# Patient Record
Sex: Female | Born: 1990 | Race: White | Hispanic: No | Marital: Married | Smoking: Never smoker
Health system: Southern US, Community
[De-identification: ages and names within clinical notes are randomized; demographics above are authoritative.]

## PROBLEM LIST (undated history)

## (undated) DIAGNOSIS — O139 Gestational [pregnancy-induced] hypertension without significant proteinuria, unspecified trimester: Secondary | ICD-10-CM

## (undated) DIAGNOSIS — T7840XA Allergy, unspecified, initial encounter: Secondary | ICD-10-CM

## (undated) DIAGNOSIS — F419 Anxiety disorder, unspecified: Secondary | ICD-10-CM

## (undated) DIAGNOSIS — O09299 Supervision of pregnancy with other poor reproductive or obstetric history, unspecified trimester: Secondary | ICD-10-CM

## (undated) DIAGNOSIS — F32A Depression, unspecified: Secondary | ICD-10-CM

## (undated) HISTORY — DX: Supervision of pregnancy with other poor reproductive or obstetric history, unspecified trimester: O09.299

## (undated) HISTORY — DX: Anxiety disorder, unspecified: F41.9

## (undated) HISTORY — DX: Allergy, unspecified, initial encounter: T78.40XA

## (undated) HISTORY — DX: Gestational (pregnancy-induced) hypertension without significant proteinuria, unspecified trimester: O13.9

## (undated) HISTORY — DX: Depression, unspecified: F32.A

## (undated) HISTORY — PX: CHOLECYSTECTOMY: SHX55

---

## 2020-03-30 ENCOUNTER — Other Ambulatory Visit: Payer: BC Managed Care – PPO

## 2020-03-30 DIAGNOSIS — Z20822 Contact with and (suspected) exposure to covid-19: Secondary | ICD-10-CM

## 2020-03-31 LAB — NOVEL CORONAVIRUS, NAA: SARS-CoV-2, NAA: NOT DETECTED

## 2020-03-31 LAB — SARS-COV-2, NAA 2 DAY TAT

## 2020-06-07 ENCOUNTER — Emergency Department (HOSPITAL_COMMUNITY)
Admission: EM | Admit: 2020-06-07 | Discharge: 2020-06-07 | Disposition: A | Discharge status: Home / Self Care | Payer: 59 | Attending: Emergency Medicine | Admitting: Emergency Medicine

## 2020-06-07 ENCOUNTER — Other Ambulatory Visit: Payer: Self-pay

## 2020-06-07 ENCOUNTER — Encounter (HOSPITAL_COMMUNITY): Payer: Self-pay | Admitting: Emergency Medicine

## 2020-06-07 ENCOUNTER — Emergency Department (HOSPITAL_COMMUNITY): Payer: 59

## 2020-06-07 DIAGNOSIS — W001XXA Fall from stairs and steps due to ice and snow, initial encounter: Secondary | ICD-10-CM | POA: Diagnosis not present

## 2020-06-07 DIAGNOSIS — Z5321 Procedure and treatment not carried out due to patient leaving prior to being seen by health care provider: Secondary | ICD-10-CM | POA: Insufficient documentation

## 2020-06-07 DIAGNOSIS — M533 Sacrococcygeal disorders, not elsewhere classified: Secondary | ICD-10-CM | POA: Diagnosis present

## 2020-06-07 NOTE — ED Notes (Signed)
Pt spoke to this tech and stated they wanted to go to another hospital to be seen. Pt left at this time.

## 2020-06-07 NOTE — ED Triage Notes (Signed)
Pt reports slipping on ice and falling down approx 4 steps this am. States she thinks she hit her head when she was trying to get up, c/o tailbone pain as well, denies LOC.

## 2020-08-18 DIAGNOSIS — R1319 Other dysphagia: Secondary | ICD-10-CM | POA: Diagnosis not present

## 2020-09-13 DIAGNOSIS — F902 Attention-deficit hyperactivity disorder, combined type: Secondary | ICD-10-CM | POA: Diagnosis not present

## 2020-09-13 DIAGNOSIS — F411 Generalized anxiety disorder: Secondary | ICD-10-CM | POA: Diagnosis not present

## 2020-09-13 DIAGNOSIS — F331 Major depressive disorder, recurrent, moderate: Secondary | ICD-10-CM | POA: Diagnosis not present

## 2020-09-20 DIAGNOSIS — F902 Attention-deficit hyperactivity disorder, combined type: Secondary | ICD-10-CM | POA: Diagnosis not present

## 2020-09-20 DIAGNOSIS — F411 Generalized anxiety disorder: Secondary | ICD-10-CM | POA: Diagnosis not present

## 2020-09-20 DIAGNOSIS — F331 Major depressive disorder, recurrent, moderate: Secondary | ICD-10-CM | POA: Diagnosis not present

## 2020-09-28 DIAGNOSIS — F902 Attention-deficit hyperactivity disorder, combined type: Secondary | ICD-10-CM | POA: Diagnosis not present

## 2020-09-28 DIAGNOSIS — F411 Generalized anxiety disorder: Secondary | ICD-10-CM | POA: Diagnosis not present

## 2020-09-28 DIAGNOSIS — F331 Major depressive disorder, recurrent, moderate: Secondary | ICD-10-CM | POA: Diagnosis not present

## 2020-10-04 DIAGNOSIS — F331 Major depressive disorder, recurrent, moderate: Secondary | ICD-10-CM | POA: Diagnosis not present

## 2020-10-04 DIAGNOSIS — F902 Attention-deficit hyperactivity disorder, combined type: Secondary | ICD-10-CM | POA: Diagnosis not present

## 2020-10-04 DIAGNOSIS — F411 Generalized anxiety disorder: Secondary | ICD-10-CM | POA: Diagnosis not present

## 2020-10-11 DIAGNOSIS — F902 Attention-deficit hyperactivity disorder, combined type: Secondary | ICD-10-CM | POA: Diagnosis not present

## 2020-10-11 DIAGNOSIS — F331 Major depressive disorder, recurrent, moderate: Secondary | ICD-10-CM | POA: Diagnosis not present

## 2020-10-11 DIAGNOSIS — F411 Generalized anxiety disorder: Secondary | ICD-10-CM | POA: Diagnosis not present

## 2020-10-18 DIAGNOSIS — F341 Dysthymic disorder: Secondary | ICD-10-CM | POA: Diagnosis not present

## 2020-10-18 DIAGNOSIS — F840 Autistic disorder: Secondary | ICD-10-CM | POA: Diagnosis not present

## 2020-10-18 DIAGNOSIS — F411 Generalized anxiety disorder: Secondary | ICD-10-CM | POA: Diagnosis not present

## 2020-11-02 DIAGNOSIS — F341 Dysthymic disorder: Secondary | ICD-10-CM | POA: Diagnosis not present

## 2020-11-02 DIAGNOSIS — F840 Autistic disorder: Secondary | ICD-10-CM | POA: Diagnosis not present

## 2020-11-02 DIAGNOSIS — F411 Generalized anxiety disorder: Secondary | ICD-10-CM | POA: Diagnosis not present

## 2020-11-05 DIAGNOSIS — N911 Secondary amenorrhea: Secondary | ICD-10-CM | POA: Diagnosis not present

## 2020-11-08 DIAGNOSIS — F411 Generalized anxiety disorder: Secondary | ICD-10-CM | POA: Diagnosis not present

## 2020-11-08 DIAGNOSIS — F840 Autistic disorder: Secondary | ICD-10-CM | POA: Diagnosis not present

## 2020-11-08 DIAGNOSIS — F341 Dysthymic disorder: Secondary | ICD-10-CM | POA: Diagnosis not present

## 2020-11-11 DIAGNOSIS — Z3481 Encounter for supervision of other normal pregnancy, first trimester: Secondary | ICD-10-CM | POA: Diagnosis not present

## 2020-11-11 DIAGNOSIS — Z3685 Encounter for antenatal screening for Streptococcus B: Secondary | ICD-10-CM | POA: Diagnosis not present

## 2020-11-11 LAB — OB RESULTS CONSOLE RPR: RPR: NONREACTIVE

## 2020-11-11 LAB — OB RESULTS CONSOLE ABO/RH: RH Type: POSITIVE

## 2020-11-11 LAB — OB RESULTS CONSOLE HIV ANTIBODY (ROUTINE TESTING): HIV: NONREACTIVE

## 2020-11-11 LAB — OB RESULTS CONSOLE GC/CHLAMYDIA
Chlamydia: NEGATIVE
Gonorrhea: NEGATIVE

## 2020-11-11 LAB — OB RESULTS CONSOLE HEPATITIS B SURFACE ANTIGEN: Hepatitis B Surface Ag: NEGATIVE

## 2020-11-11 LAB — HEPATITIS C ANTIBODY: HCV Ab: NEGATIVE

## 2020-11-11 LAB — OB RESULTS CONSOLE RUBELLA ANTIBODY, IGM: Rubella: IMMUNE

## 2020-11-11 LAB — OB RESULTS CONSOLE ANTIBODY SCREEN: Antibody Screen: NEGATIVE

## 2020-11-15 DIAGNOSIS — F411 Generalized anxiety disorder: Secondary | ICD-10-CM | POA: Diagnosis not present

## 2020-11-15 DIAGNOSIS — F341 Dysthymic disorder: Secondary | ICD-10-CM | POA: Diagnosis not present

## 2020-11-15 DIAGNOSIS — F840 Autistic disorder: Secondary | ICD-10-CM | POA: Diagnosis not present

## 2020-11-22 DIAGNOSIS — F840 Autistic disorder: Secondary | ICD-10-CM | POA: Diagnosis not present

## 2020-11-22 DIAGNOSIS — F411 Generalized anxiety disorder: Secondary | ICD-10-CM | POA: Diagnosis not present

## 2020-11-22 DIAGNOSIS — F341 Dysthymic disorder: Secondary | ICD-10-CM | POA: Diagnosis not present

## 2020-11-29 DIAGNOSIS — Z124 Encounter for screening for malignant neoplasm of cervix: Secondary | ICD-10-CM | POA: Diagnosis not present

## 2020-11-29 DIAGNOSIS — Z113 Encounter for screening for infections with a predominantly sexual mode of transmission: Secondary | ICD-10-CM | POA: Diagnosis not present

## 2020-11-29 DIAGNOSIS — F341 Dysthymic disorder: Secondary | ICD-10-CM | POA: Diagnosis not present

## 2020-11-29 DIAGNOSIS — F411 Generalized anxiety disorder: Secondary | ICD-10-CM | POA: Diagnosis not present

## 2020-11-29 DIAGNOSIS — Z3400 Encounter for supervision of normal first pregnancy, unspecified trimester: Secondary | ICD-10-CM | POA: Diagnosis not present

## 2020-11-29 DIAGNOSIS — F840 Autistic disorder: Secondary | ICD-10-CM | POA: Diagnosis not present

## 2020-12-06 DIAGNOSIS — F341 Dysthymic disorder: Secondary | ICD-10-CM | POA: Diagnosis not present

## 2020-12-06 DIAGNOSIS — F840 Autistic disorder: Secondary | ICD-10-CM | POA: Diagnosis not present

## 2020-12-06 DIAGNOSIS — F411 Generalized anxiety disorder: Secondary | ICD-10-CM | POA: Diagnosis not present

## 2020-12-07 DIAGNOSIS — Z3481 Encounter for supervision of other normal pregnancy, first trimester: Secondary | ICD-10-CM | POA: Diagnosis not present

## 2020-12-07 DIAGNOSIS — Z3A12 12 weeks gestation of pregnancy: Secondary | ICD-10-CM | POA: Diagnosis not present

## 2020-12-07 DIAGNOSIS — Z3682 Encounter for antenatal screening for nuchal translucency: Secondary | ICD-10-CM | POA: Diagnosis not present

## 2020-12-13 DIAGNOSIS — F840 Autistic disorder: Secondary | ICD-10-CM | POA: Diagnosis not present

## 2020-12-13 DIAGNOSIS — F411 Generalized anxiety disorder: Secondary | ICD-10-CM | POA: Diagnosis not present

## 2020-12-13 DIAGNOSIS — F341 Dysthymic disorder: Secondary | ICD-10-CM | POA: Diagnosis not present

## 2020-12-20 DIAGNOSIS — F341 Dysthymic disorder: Secondary | ICD-10-CM | POA: Diagnosis not present

## 2020-12-20 DIAGNOSIS — F411 Generalized anxiety disorder: Secondary | ICD-10-CM | POA: Diagnosis not present

## 2020-12-20 DIAGNOSIS — F840 Autistic disorder: Secondary | ICD-10-CM | POA: Diagnosis not present

## 2020-12-27 DIAGNOSIS — Z361 Encounter for antenatal screening for raised alphafetoprotein level: Secondary | ICD-10-CM | POA: Diagnosis not present

## 2020-12-27 DIAGNOSIS — F341 Dysthymic disorder: Secondary | ICD-10-CM | POA: Diagnosis not present

## 2020-12-27 DIAGNOSIS — F840 Autistic disorder: Secondary | ICD-10-CM | POA: Diagnosis not present

## 2020-12-27 DIAGNOSIS — F411 Generalized anxiety disorder: Secondary | ICD-10-CM | POA: Diagnosis not present

## 2021-01-10 DIAGNOSIS — F840 Autistic disorder: Secondary | ICD-10-CM | POA: Diagnosis not present

## 2021-01-10 DIAGNOSIS — F411 Generalized anxiety disorder: Secondary | ICD-10-CM | POA: Diagnosis not present

## 2021-01-10 DIAGNOSIS — F341 Dysthymic disorder: Secondary | ICD-10-CM | POA: Diagnosis not present

## 2021-01-17 DIAGNOSIS — F341 Dysthymic disorder: Secondary | ICD-10-CM | POA: Diagnosis not present

## 2021-01-17 DIAGNOSIS — F840 Autistic disorder: Secondary | ICD-10-CM | POA: Diagnosis not present

## 2021-01-17 DIAGNOSIS — F411 Generalized anxiety disorder: Secondary | ICD-10-CM | POA: Diagnosis not present

## 2021-01-24 DIAGNOSIS — F411 Generalized anxiety disorder: Secondary | ICD-10-CM | POA: Diagnosis not present

## 2021-01-24 DIAGNOSIS — Z3A19 19 weeks gestation of pregnancy: Secondary | ICD-10-CM | POA: Diagnosis not present

## 2021-01-24 DIAGNOSIS — Z363 Encounter for antenatal screening for malformations: Secondary | ICD-10-CM | POA: Diagnosis not present

## 2021-01-24 DIAGNOSIS — F341 Dysthymic disorder: Secondary | ICD-10-CM | POA: Diagnosis not present

## 2021-01-24 DIAGNOSIS — F840 Autistic disorder: Secondary | ICD-10-CM | POA: Diagnosis not present

## 2021-02-18 DIAGNOSIS — F411 Generalized anxiety disorder: Secondary | ICD-10-CM | POA: Diagnosis not present

## 2021-02-18 DIAGNOSIS — F840 Autistic disorder: Secondary | ICD-10-CM | POA: Diagnosis not present

## 2021-02-18 DIAGNOSIS — F341 Dysthymic disorder: Secondary | ICD-10-CM | POA: Diagnosis not present

## 2021-02-21 DIAGNOSIS — Z20822 Contact with and (suspected) exposure to covid-19: Secondary | ICD-10-CM | POA: Diagnosis not present

## 2021-02-25 DIAGNOSIS — Z3A23 23 weeks gestation of pregnancy: Secondary | ICD-10-CM | POA: Diagnosis not present

## 2021-02-25 DIAGNOSIS — N760 Acute vaginitis: Secondary | ICD-10-CM | POA: Diagnosis not present

## 2021-02-25 DIAGNOSIS — Z362 Encounter for other antenatal screening follow-up: Secondary | ICD-10-CM | POA: Diagnosis not present

## 2021-03-04 DIAGNOSIS — F341 Dysthymic disorder: Secondary | ICD-10-CM | POA: Diagnosis not present

## 2021-03-04 DIAGNOSIS — F840 Autistic disorder: Secondary | ICD-10-CM | POA: Diagnosis not present

## 2021-03-04 DIAGNOSIS — F411 Generalized anxiety disorder: Secondary | ICD-10-CM | POA: Diagnosis not present

## 2021-03-21 DIAGNOSIS — Z3480 Encounter for supervision of other normal pregnancy, unspecified trimester: Secondary | ICD-10-CM | POA: Diagnosis not present

## 2021-04-01 DIAGNOSIS — F341 Dysthymic disorder: Secondary | ICD-10-CM | POA: Diagnosis not present

## 2021-04-01 DIAGNOSIS — F411 Generalized anxiety disorder: Secondary | ICD-10-CM | POA: Diagnosis not present

## 2021-04-01 DIAGNOSIS — F840 Autistic disorder: Secondary | ICD-10-CM | POA: Diagnosis not present

## 2021-04-08 DIAGNOSIS — Z23 Encounter for immunization: Secondary | ICD-10-CM | POA: Diagnosis not present

## 2021-04-18 DIAGNOSIS — O2693 Pregnancy related conditions, unspecified, third trimester: Secondary | ICD-10-CM | POA: Diagnosis not present

## 2021-04-18 DIAGNOSIS — Z3A31 31 weeks gestation of pregnancy: Secondary | ICD-10-CM | POA: Diagnosis not present

## 2021-05-01 NOTE — L&D Delivery Note (Signed)
Delivery Note At 1:54 PM a viable female was delivered via Vaginal, Spontaneous (Presentation:   LOA   ).  APGAR: pend, ; weight pend .   Placenta status: Spontaneous, Intact.  Cord: 3 vessels with the following complications: None.  Uterus cleared of all clot and debris. Bimanual uterine massage given for initial atony and bleeding that quickly resolved.   Anesthesia: Epidural Episiotomy: None Lacerations:  1st degree perineal, left periurethral. Suture Repair: 2.0 vicryl Est. Blood Loss (mL):  400 cc  Mom to postpartum.  Baby to NICU for tachypnea.  Lyn Henri 06/10/2021, 2:31 PM

## 2021-05-05 DIAGNOSIS — Z3A33 33 weeks gestation of pregnancy: Secondary | ICD-10-CM | POA: Diagnosis not present

## 2021-05-05 DIAGNOSIS — O368130 Decreased fetal movements, third trimester, not applicable or unspecified: Secondary | ICD-10-CM | POA: Diagnosis not present

## 2021-05-06 DIAGNOSIS — F341 Dysthymic disorder: Secondary | ICD-10-CM | POA: Diagnosis not present

## 2021-05-06 DIAGNOSIS — F411 Generalized anxiety disorder: Secondary | ICD-10-CM | POA: Diagnosis not present

## 2021-05-06 DIAGNOSIS — F840 Autistic disorder: Secondary | ICD-10-CM | POA: Diagnosis not present

## 2021-05-13 DIAGNOSIS — O99323 Drug use complicating pregnancy, third trimester: Secondary | ICD-10-CM | POA: Diagnosis not present

## 2021-05-13 DIAGNOSIS — Z3A34 34 weeks gestation of pregnancy: Secondary | ICD-10-CM | POA: Diagnosis not present

## 2021-05-20 DIAGNOSIS — F411 Generalized anxiety disorder: Secondary | ICD-10-CM | POA: Diagnosis not present

## 2021-05-20 DIAGNOSIS — F341 Dysthymic disorder: Secondary | ICD-10-CM | POA: Diagnosis not present

## 2021-05-20 DIAGNOSIS — F840 Autistic disorder: Secondary | ICD-10-CM | POA: Diagnosis not present

## 2021-05-20 DIAGNOSIS — O1413 Severe pre-eclampsia, third trimester: Secondary | ICD-10-CM | POA: Diagnosis not present

## 2021-05-23 DIAGNOSIS — Z3685 Encounter for antenatal screening for Streptococcus B: Secondary | ICD-10-CM | POA: Diagnosis not present

## 2021-05-26 DIAGNOSIS — F341 Dysthymic disorder: Secondary | ICD-10-CM | POA: Diagnosis not present

## 2021-05-26 DIAGNOSIS — F840 Autistic disorder: Secondary | ICD-10-CM | POA: Diagnosis not present

## 2021-05-26 DIAGNOSIS — F411 Generalized anxiety disorder: Secondary | ICD-10-CM | POA: Diagnosis not present

## 2021-05-27 LAB — OB RESULTS CONSOLE GBS: GBS: NEGATIVE

## 2021-05-31 DIAGNOSIS — R609 Edema, unspecified: Secondary | ICD-10-CM | POA: Diagnosis not present

## 2021-06-07 DIAGNOSIS — F341 Dysthymic disorder: Secondary | ICD-10-CM | POA: Diagnosis not present

## 2021-06-07 DIAGNOSIS — F411 Generalized anxiety disorder: Secondary | ICD-10-CM | POA: Diagnosis not present

## 2021-06-07 DIAGNOSIS — F840 Autistic disorder: Secondary | ICD-10-CM | POA: Diagnosis not present

## 2021-06-08 ENCOUNTER — Encounter (HOSPITAL_COMMUNITY): Payer: Self-pay | Admitting: *Deleted

## 2021-06-08 ENCOUNTER — Telehealth (HOSPITAL_COMMUNITY): Payer: Self-pay | Admitting: *Deleted

## 2021-06-08 NOTE — Telephone Encounter (Signed)
Preadmission screen  

## 2021-06-09 ENCOUNTER — Encounter (HOSPITAL_COMMUNITY): Payer: Self-pay | Admitting: *Deleted

## 2021-06-10 ENCOUNTER — Inpatient Hospital Stay (HOSPITAL_COMMUNITY): Payer: BC Managed Care – PPO | Admitting: Anesthesiology

## 2021-06-10 ENCOUNTER — Encounter (HOSPITAL_COMMUNITY): Payer: Self-pay | Admitting: Obstetrics and Gynecology

## 2021-06-10 ENCOUNTER — Inpatient Hospital Stay (HOSPITAL_COMMUNITY)
Admission: AD | Admit: 2021-06-10 | Discharge: 2021-06-12 | DRG: 806 | Disposition: A | Discharge status: Home / Self Care | Payer: BC Managed Care – PPO | Attending: Obstetrics and Gynecology | Admitting: Obstetrics and Gynecology

## 2021-06-10 ENCOUNTER — Other Ambulatory Visit: Payer: Self-pay

## 2021-06-10 DIAGNOSIS — O9081 Anemia of the puerperium: Secondary | ICD-10-CM | POA: Diagnosis not present

## 2021-06-10 DIAGNOSIS — O26893 Other specified pregnancy related conditions, third trimester: Secondary | ICD-10-CM | POA: Diagnosis not present

## 2021-06-10 DIAGNOSIS — Z20822 Contact with and (suspected) exposure to covid-19: Secondary | ICD-10-CM | POA: Diagnosis present

## 2021-06-10 DIAGNOSIS — O134 Gestational [pregnancy-induced] hypertension without significant proteinuria, complicating childbirth: Secondary | ICD-10-CM | POA: Diagnosis not present

## 2021-06-10 DIAGNOSIS — O163 Unspecified maternal hypertension, third trimester: Secondary | ICD-10-CM | POA: Diagnosis not present

## 2021-06-10 DIAGNOSIS — O479 False labor, unspecified: Principal | ICD-10-CM

## 2021-06-10 DIAGNOSIS — D62 Acute posthemorrhagic anemia: Secondary | ICD-10-CM | POA: Diagnosis not present

## 2021-06-10 DIAGNOSIS — Z3A38 38 weeks gestation of pregnancy: Secondary | ICD-10-CM

## 2021-06-10 DIAGNOSIS — Z23 Encounter for immunization: Secondary | ICD-10-CM | POA: Diagnosis not present

## 2021-06-10 LAB — COMPREHENSIVE METABOLIC PANEL
ALT: 19 U/L (ref 0–44)
AST: 19 U/L (ref 15–41)
Albumin: 2.8 g/dL — ABNORMAL LOW (ref 3.5–5.0)
Alkaline Phosphatase: 217 U/L — ABNORMAL HIGH (ref 38–126)
Anion gap: 11 (ref 5–15)
BUN: 7 mg/dL (ref 6–20)
CO2: 19 mmol/L — ABNORMAL LOW (ref 22–32)
Calcium: 9.3 mg/dL (ref 8.9–10.3)
Chloride: 107 mmol/L (ref 98–111)
Creatinine, Ser: 0.63 mg/dL (ref 0.44–1.00)
GFR, Estimated: >60 mL/min (ref >60)
Glucose, Bld: 71 mg/dL (ref 70–99)
Potassium: 3.7 mmol/L (ref 3.5–5.1)
Sodium: 137 mmol/L (ref 135–145)
Total Bilirubin: 0.5 mg/dL (ref 0.3–1.2)
Total Protein: 6.1 g/dL — ABNORMAL LOW (ref 6.5–8.1)

## 2021-06-10 LAB — URINALYSIS, ROUTINE W REFLEX MICROSCOPIC
Bilirubin Urine: NEGATIVE
Glucose, UA: NEGATIVE mg/dL
Hgb urine dipstick: NEGATIVE
Ketones, ur: NEGATIVE mg/dL
Nitrite: NEGATIVE
Protein, ur: NEGATIVE mg/dL
Specific Gravity, Urine: 1.006 (ref 1.005–1.030)
pH: 6 (ref 5.0–8.0)

## 2021-06-10 LAB — PROTEIN / CREATININE RATIO, URINE
Creatinine, Urine: 62.63 mg/dL
Total Protein, Urine: <6 mg/dL

## 2021-06-10 LAB — CBC
HCT: 33.7 % — ABNORMAL LOW (ref 36.0–46.0)
Hemoglobin: 11.1 g/dL — ABNORMAL LOW (ref 12.0–15.0)
MCH: 27.1 pg (ref 26.0–34.0)
MCHC: 32.9 g/dL (ref 30.0–36.0)
MCV: 82.2 fL (ref 80.0–100.0)
Platelets: 235 10*3/uL (ref 150–400)
RBC: 4.1 MIL/uL (ref 3.87–5.11)
RDW: 12.1 % (ref 11.5–15.5)
WBC: 10.7 10*3/uL — ABNORMAL HIGH (ref 4.0–10.5)
nRBC: 0 % (ref 0.0–0.2)

## 2021-06-10 LAB — RPR: RPR Ser Ql: NONREACTIVE

## 2021-06-10 LAB — RESP PANEL BY RT-PCR (FLU A&B, COVID) ARPGX2
Influenza A by PCR: NEGATIVE
Influenza B by PCR: NEGATIVE
SARS Coronavirus 2 by RT PCR: NEGATIVE

## 2021-06-10 LAB — TYPE AND SCREEN
ABO/RH(D): B POS
Antibody Screen: NEGATIVE

## 2021-06-10 MED ORDER — ACETAMINOPHEN 325 MG PO TABS: 650.0000 mg | ORAL_TABLET | ORAL | Status: DC | PRN

## 2021-06-10 MED ORDER — ONDANSETRON HCL 4 MG/2ML IJ SOLN: 4.0000 mg | INTRAMUSCULAR | Status: DC | PRN

## 2021-06-10 MED ORDER — EPHEDRINE 5 MG/ML INJ: 10.0000 mg | INTRAVENOUS | Status: DC | PRN

## 2021-06-10 MED ORDER — BENZOCAINE-MENTHOL 20-0.5 % EX AERO
1.0000 "application " | INHALATION_SPRAY | CUTANEOUS | Status: DC | PRN
  Administered 2021-06-10 – 2021-06-12 (×2): 1 via TOPICAL
  Filled 2021-06-10 (×2): qty 56

## 2021-06-10 MED ORDER — BUPROPION HCL ER (XL) 300 MG PO TB24
300.0000 mg | ORAL_TABLET | Freq: Every day | ORAL | Status: DC
  Filled 2021-06-10 (×3): qty 1

## 2021-06-10 MED ORDER — FENTANYL-BUPIVACAINE-NACL 0.5-0.125-0.9 MG/250ML-% EP SOLN
EPIDURAL | Status: DC | PRN
  Administered 2021-06-10: 12 mL/h via EPIDURAL

## 2021-06-10 MED ORDER — LIDOCAINE HCL (PF) 1 % IJ SOLN
INTRAMUSCULAR | Status: DC | PRN
  Administered 2021-06-10: 5 mL via EPIDURAL

## 2021-06-10 MED ORDER — PHENYLEPHRINE 40 MCG/ML (10ML) SYRINGE FOR IV PUSH (FOR BLOOD PRESSURE SUPPORT): 80.0000 ug | PREFILLED_SYRINGE | INTRAVENOUS | Status: DC | PRN

## 2021-06-10 MED ORDER — DIPHENHYDRAMINE HCL 50 MG/ML IJ SOLN
12.5000 mg | INTRAMUSCULAR | Status: DC | PRN
  Administered 2021-06-10: 12.5 mg via INTRAVENOUS
  Filled 2021-06-10: qty 1

## 2021-06-10 MED ORDER — FLEET ENEMA 7-19 GM/118ML RE ENEM: 1.0000 | ENEMA | RECTAL | Status: DC | PRN

## 2021-06-10 MED ORDER — DIPHENHYDRAMINE HCL 25 MG PO CAPS: 25.0000 mg | ORAL_CAPSULE | Freq: Four times a day (QID) | ORAL | Status: DC | PRN

## 2021-06-10 MED ORDER — TETANUS-DIPHTH-ACELL PERTUSSIS 5-2.5-18.5 LF-MCG/0.5 IM SUSY: 0.5000 mL | PREFILLED_SYRINGE | Freq: Once | INTRAMUSCULAR | Status: DC

## 2021-06-10 MED ORDER — SOD CITRATE-CITRIC ACID 500-334 MG/5ML PO SOLN: 30.0000 mL | ORAL | Status: DC | PRN

## 2021-06-10 MED ORDER — DIBUCAINE (PERIANAL) 1 % EX OINT: 1.0000 "application " | TOPICAL_OINTMENT | CUTANEOUS | Status: DC | PRN

## 2021-06-10 MED ORDER — FLUOXETINE HCL 20 MG PO CAPS
20.0000 mg | ORAL_CAPSULE | Freq: Every day | ORAL | Status: DC
  Filled 2021-06-10: qty 1

## 2021-06-10 MED ORDER — IBUPROFEN 600 MG PO TABS
600.0000 mg | ORAL_TABLET | Freq: Four times a day (QID) | ORAL | Status: DC
  Administered 2021-06-10 – 2021-06-12 (×8): 600 mg via ORAL
  Filled 2021-06-10 (×8): qty 1

## 2021-06-10 MED ORDER — COCONUT OIL OIL: 1.0000 "application " | TOPICAL_OIL | Status: DC | PRN

## 2021-06-10 MED ORDER — LACTATED RINGERS IV SOLN
500.0000 mL | Freq: Once | INTRAVENOUS | Status: AC
  Administered 2021-06-10: 500 mL via INTRAVENOUS

## 2021-06-10 MED ORDER — OXYCODONE-ACETAMINOPHEN 5-325 MG PO TABS: 1.0000 | ORAL_TABLET | ORAL | Status: DC | PRN

## 2021-06-10 MED ORDER — WITCH HAZEL-GLYCERIN EX PADS: 1.0000 "application " | MEDICATED_PAD | CUTANEOUS | Status: DC | PRN

## 2021-06-10 MED ORDER — FENTANYL-BUPIVACAINE-NACL 0.5-0.125-0.9 MG/250ML-% EP SOLN
12.0000 mL/h | EPIDURAL | Status: DC | PRN
  Filled 2021-06-10: qty 250

## 2021-06-10 MED ORDER — LACTATED RINGERS IV SOLN: INTRAVENOUS | Status: DC

## 2021-06-10 MED ORDER — OXYTOCIN-SODIUM CHLORIDE 30-0.9 UT/500ML-% IV SOLN
2.5000 [IU]/h | INTRAVENOUS | Status: DC
  Filled 2021-06-10: qty 500

## 2021-06-10 MED ORDER — SENNOSIDES-DOCUSATE SODIUM 8.6-50 MG PO TABS
2.0000 | ORAL_TABLET | ORAL | Status: DC
  Administered 2021-06-10 – 2021-06-12 (×3): 2 via ORAL
  Filled 2021-06-10 (×3): qty 2

## 2021-06-10 MED ORDER — ONDANSETRON HCL 4 MG PO TABS: 4.0000 mg | ORAL_TABLET | ORAL | Status: DC | PRN

## 2021-06-10 MED ORDER — OXYTOCIN BOLUS FROM INFUSION
333.0000 mL | Freq: Once | INTRAVENOUS | Status: AC
  Administered 2021-06-10: 333 mL via INTRAVENOUS

## 2021-06-10 MED ORDER — LACTATED RINGERS IV SOLN: 500.0000 mL | INTRAVENOUS | Status: DC | PRN

## 2021-06-10 MED ORDER — LIDOCAINE HCL (PF) 1 % IJ SOLN
30.0000 mL | INTRAMUSCULAR | Status: AC | PRN
  Administered 2021-06-10: 30 mL via SUBCUTANEOUS
  Filled 2021-06-10: qty 30

## 2021-06-10 MED ORDER — OXYCODONE-ACETAMINOPHEN 5-325 MG PO TABS: 2.0000 | ORAL_TABLET | ORAL | Status: DC | PRN

## 2021-06-10 MED ORDER — ZOLPIDEM TARTRATE 5 MG PO TABS: 5.0000 mg | ORAL_TABLET | Freq: Every evening | ORAL | Status: DC | PRN

## 2021-06-10 MED ORDER — SIMETHICONE 80 MG PO CHEW: 80.0000 mg | CHEWABLE_TABLET | ORAL | Status: DC | PRN

## 2021-06-10 MED ORDER — ONDANSETRON HCL 4 MG/2ML IJ SOLN: 4.0000 mg | Freq: Four times a day (QID) | INTRAMUSCULAR | Status: DC | PRN

## 2021-06-10 MED ORDER — PRENATAL MULTIVITAMIN CH
1.0000 | ORAL_TABLET | Freq: Every day | ORAL | Status: DC
  Administered 2021-06-11 – 2021-06-12 (×2): 1 via ORAL
  Filled 2021-06-10 (×2): qty 1

## 2021-06-10 NOTE — Progress Notes (Signed)
AROM performed in typical fashion with return of clear fluid.  Head was well applied.  Patient tolerated well.  FHT cat 1, cervix 7-8/100/-1.  Anticipate vaginal delivery.   Nilda Simmer MD

## 2021-06-10 NOTE — MAU Note (Signed)
Verified vertex presentation with Hansel Feinstein, CNM by bedside ultrasound

## 2021-06-10 NOTE — MAU Provider Note (Signed)
Chief Complaint:  Contractions   Event Date/Time   First Provider Initiated Contact with Patient 06/10/21 [phone]     HPI: Melanie Mora is a 31 y.o. G1P0 at 80w6dwho presents to maternity admissions reporting painful contractions.  Was 1cm in office. She reports good fetal movement, denies LOF, vaginal bleeding, vaginal itching/burning, urinary symptoms, h/a, dizziness, n/v, diarrhea, constipation or fever/chills.  She denies headache, visual changes or RUQ abdominal pain.  Abdominal Pain This is a new problem. The current episode started today. The problem occurs intermittently. Pertinent negatives include no fever, headaches, myalgias, nausea or vomiting. Nothing aggravates the pain. The pain is relieved by Nothing.  Hypertension This is a new problem. The current episode started today. Pertinent negatives include no blurred vision, chest pain, headaches, malaise/fatigue or peripheral edema. There are no associated agents to hypertension. There are no known risk factors for coronary artery disease. Past treatments include nothing. There are no compliance problems.     Past Medical History: History reviewed. No pertinent past medical history.  Past obstetric history: OB History  Gravida Para Term Preterm AB Living  1            SAB IAB Ectopic Multiple Live Births               # Outcome Date GA Lbr Len/2nd Weight Sex Delivery Anes PTL Lv  1 Current             Past Surgical History: Past Surgical History:  Procedure Laterality Date   CHOLECYSTECTOMY      Family History: History reviewed. No pertinent family history.  Social History: Social History   Tobacco Use   Smoking status: Never   Smokeless tobacco: Never  Vaping Use   Vaping Use: Never used  Substance Use Topics   Alcohol use: Not Currently   Drug use: Never    Allergies: No Known Allergies  Meds:  Medications Prior to Admission  Medication Sig Dispense Refill Last Dose   buPROPion (WELLBUTRIN XL) 300  MG 24 hr tablet Take 300 mg by mouth daily.   06/09/2021   fexofenadine (ALLEGRA) 60 MG tablet Take 60 mg by mouth 2 (two) times daily.   06/09/2021   FLUoxetine (PROZAC) 20 MG capsule Take 20 mg by mouth daily.   06/09/2021   Prenatal Vit-Fe Fumarate-FA (PRENATAL MULTIVITAMIN) TABS tablet Take 1 tablet by mouth daily at 12 noon.   06/09/2021    I have reviewed patient's Past Medical Hx, Surgical Hx, Family Hx, Social Hx, medications and allergies.   ROS:  Review of Systems  Constitutional:  Negative for fever and malaise/fatigue.  Eyes:  Negative for blurred vision.  Cardiovascular:  Negative for chest pain.  Gastrointestinal:  Positive for abdominal pain. Negative for nausea and vomiting.  Musculoskeletal:  Negative for myalgias.  Neurological:  Negative for headaches.  Other systems negative  Physical Exam  Patient Vitals for the past 24 hrs:  BP Temp Pulse Resp SpO2 Height Weight  06/10/21 0512 (!) 140/95 -- 81 -- -- -- --  06/10/21 0452 (!) 131/91 -- -- -- -- -- --  06/10/21 0451 -- -- 78 -- 99 % -- --  06/10/21 0450 -- 97.7 F (36.5 C) -- 17 -- 5\' 3"  (1.6 m) 91.6 kg   Constitutional: Well-developed, well-nourished female in no acute distress.  Cardiovascular: normal rate and rhythm Respiratory: normal effort, clear to auscultation bilaterally GI: Abd soft, non-tender, gravid appropriate for gestational age.   No rebound or guarding. MS: Extremities  nontender, no edema, normal ROM Neurologic: Alert and oriented x 4.  GU: Neg CVAT.  PELVIC EXAM:  Dilation: 4 Effacement (%): 70 Station: -2 Presentation: Vertex Exam by:: [employer], RN  FHT:  Baseline 140 , moderate variability, accelerations present, no decelerations Contractions: q 2-3 mins Irregular    Labs: No results found for this or any previous visit (from the past 24 hour(s)). B/Positive/-- (07/14 0000)  Imaging:  Pt informed that the ultrasound is considered a limited OB ultrasound and is not intended to  be a complete ultrasound exam.  Patient also informed that the ultrasound is not being completed with the intent of assessing for fetal or placental anomalies or any pelvic abnormalities.  Explained that the purpose of todays ultrasound is to assess for presentation  Patient acknowledges the purpose of the exam and the limitations of the study.   Fetus is vertex   MAU Course/MDM: NST reviewed, reactive  Treatments in MAU included [place], EFM.    Assessment: Single IUP at [redacted]w[redacted]d Labor New Gestational  Hypertension  Plan: Admit to Labor and Delivery Routine orders Preeclampsia labs ordered MD to follow.  Wynelle Bourgeois CNM, MSN Certified Nurse-Midwife 06/10/2021 5:23 AM

## 2021-06-10 NOTE — Anesthesia Preprocedure Evaluation (Signed)
Anesthesia Evaluation  Patient identified by MRN, date of birth, ID band Patient awake    Reviewed: Allergy & Precautions, NPO status , Patient's Chart, lab work & pertinent test results  Airway Mallampati: II  TM Distance: >3 FB Neck ROM: Full    Dental no notable dental hx. (+) Teeth Intact, Dental Advisory Given   Pulmonary neg pulmonary ROS,    Pulmonary exam normal breath sounds clear to auscultation       Cardiovascular hypertension (gHTN), Normal cardiovascular exam Rhythm:Regular Rate:Normal     Neuro/Psych negative neurological ROS  negative psych ROS   GI/Hepatic negative GI ROS, Neg liver ROS,   Endo/Other  negative endocrine ROS  Renal/GU negative Renal ROS     Musculoskeletal negative musculoskeletal ROS (+)   Abdominal (+) + obese (BMI 35.8),   Peds negative pediatric ROS (+)  Hematology Lab Results      Component                Value               Date                       HGB                      11.1 (L)            06/10/2021                HCT                      33.7 (L)            06/10/2021                    PLT                      235                 06/10/2021              Anesthesia Other Findings NKA  Reproductive/Obstetrics (+) Pregnancy                             Anesthesia Physical Anesthesia Plan  ASA: 3  Anesthesia Plan: Epidural   Post-op Pain Management:    Induction:   PONV Risk Score and Plan:   Airway Management Planned:   Additional Equipment:   Intra-op Plan:   Post-operative Plan:   Informed Consent: I have reviewed the patients History and Physical, chart, labs and discussed the procedure including the risks, benefits and alternatives for the proposed anesthesia with the patient or authorized representative who has indicated his/her understanding and acceptance.       Plan Discussed with:   Anesthesia Plan Comments: (38.6  wk primagravida w gHtn and inc BMI for LEA)        Anesthesia Quick Evaluation

## 2021-06-10 NOTE — Lactation Note (Signed)
This note was copied from a baby's chart. Lactation Consultation Note  Patient Name: Melanie Mora [id number] Date: 06/10/2021 Reason for consult: L&D Initial assessment;Early term 37-38.6wks;Primapara;1st time breastfeeding Age:30 hours  L&D consult with <60 minutes old infant and P1 mother. Baby has been transferred to NICU. Explained [facility] services availability during postpartum stay. Thanked family for their time.    Maternal Data Has patient been taught Hand Expression?: No Does the patient have breastfeeding experience prior to this delivery?: No  Feeding Mother's Current Feeding Choice: Breast Milk and Donor Milk  Interventions Interventions: Expressed milk;Education  Discharge Pump: Personal [place] Program: Yes  Consult Status Consult Status: Follow-up from L&D    Melanie Mora A Higuera Ancidey 06/10/2021, 2:55 PM

## 2021-06-10 NOTE — MAU Note (Signed)
Ctxs since MN that have gotten closer and stronger. Denies VB or LOF. Good FM. 1cm last sve

## 2021-06-10 NOTE — H&P (Signed)
Melanie Mora is a 31 y.o. G 1 P 0 at 38 w 6 days presented in active labor/GBBS is negative/ in process of getting epidural now OB History     Gravida  1   Para      Term      Preterm      AB      Living         SAB      IAB      Ectopic      Multiple      Live Births             History reviewed. No pertinent past medical history. Past Surgical History:  Procedure Laterality Date   CHOLECYSTECTOMY     Family History: family history is not on file. Social History:  reports that she has never smoked. She has never used smokeless tobacco. She reports that she does not currently use alcohol. She reports that she does not use drugs.     Maternal Diabetes: No Genetic Screening: Normal Maternal Ultrasounds/Referrals: Normal Fetal Ultrasounds or other Referrals:  None Maternal Substance Abuse:  No Significant Maternal Medications:  None Significant Maternal Lab Results:  Group B Strep negative Other Comments:  None  Review of Systems  All other systems reviewed and are negative. Maternal Medical History:  Reason for admission: Contractions.   Prenatal complications: no prenatal complications  Dilation: 4 Effacement (%): 70 Station: -2 Exam by:: [employer], RN Blood pressure 134/83, pulse 74, temperature 97.7 F (36.5 C), resp. rate 17, height 5\' 3"  (1.6 m), weight 91.6 kg, SpO2 99 %. Maternal Exam:  Uterine Assessment: Contraction strength is moderate.  Contraction frequency is irregular.  Abdomen: Fetal presentation: vertex   Fetal Exam Fetal State Assessment: Category I - tracings are normal.  Physical Exam Vitals and nursing note reviewed. Exam conducted with a chaperone present.  Constitutional:      Appearance: Normal appearance.  HENT:     Head: Normocephalic.  Eyes:     Pupils: Pupils are equal, round, and reactive to light.  Cardiovascular:     Rate and Rhythm: Normal rate and regular rhythm.  Neurological:     Mental  Status: She is alert.    Prenatal labs: ABO, Rh: --/--/B POS (02/10 0540) Antibody: NEG (02/10 0540) Rubella: Immune (07/14 0000) RPR: Nonreactive (07/14 0000)  HBsAg: Negative (07/14 0000)  HIV: Non-reactive (07/14 0000)  GBS: Negative/-- (01/27 0000)   Assessment/Plan: IUP at term Labor Epidural AROM after epidural Anticipate NSVD   12-08-2000 06/10/2021, 6:52 AM

## 2021-06-10 NOTE — Anesthesia Procedure Notes (Signed)
Epidural Patient location during procedure: OB Start time: 06/10/2021 6:26 AM End time: 06/10/2021 6:43 AM  Staffing Anesthesiologist: Barnet Glasgow, MD Performed: anesthesiologist   Preanesthetic Checklist Completed: patient identified, IV checked, site marked, risks and benefits discussed, surgical consent, monitors and equipment checked, pre-op evaluation and timeout performed  Epidural Patient position: sitting Prep: DuraPrep and site prepped and draped Patient monitoring: continuous pulse ox and blood pressure Approach: midline Location: L3-L4 Injection technique: LOR air  Needle:  Needle type: Tuohy  Needle gauge: 17 G Needle length: 9 cm and 9 Needle insertion depth: 7 cm Catheter type: closed end flexible Catheter size: 19 Gauge Catheter at skin depth: 12 cm Test dose: negative  Assessment Events: blood not aspirated, injection not painful, no injection resistance, no paresthesia and negative IV test  Additional Notes Patient identified. Risks/Benefits/Options discussed with patient including but not limited to bleeding, infection, nerve damage, paralysis, failed block, incomplete pain control, headache, blood pressure changes, nausea, vomiting, reactions to medication both or allergic, itching and postpartum back pain. Confirmed with bedside nurse the patient's most recent platelet count. Confirmed with patient that they are not currently taking any anticoagulation, have any bleeding history or any family history of bleeding disorders. Patient expressed understanding and wished to proceed. All questions were answered. Sterile technique was used throughout the entire procedure. Please see nursing notes for vital signs. Test dose was given through epidural needle and negative prior to continuing to dose epidural or start infusion. Warning signs of high block given to the patient including shortness of breath, tingling/numbness in hands, complete motor block, or any  concerning symptoms with instructions to call for help. Patient was given instructions on fall risk and not to get out of bed. All questions and concerns addressed with instructions to call with any issues.  1 Attempt (S) . Patient tolerated procedure well.

## 2021-06-10 NOTE — Lactation Note (Signed)
This note was copied from a baby's chart. Lactation Consultation Note  Patient Name: Melanie Mora Melanie Mora [id number] Date: 06/10/2021 Reason for consult: Follow-up assessment;NICU baby;1st time breastfeeding;Primapara;Early term 37-38.6wks Age:30 hours  Visited with mom of 3 hours old ETI NICU female, she's a P1 and reported (++) breast changes during the pregnancy. Baby is a NICU transit, he'll most likely come back to mom's room after his 5 pm feeding. Mom prefers to put baby to breast instead of starting pumping right away.  Provided education about adequate breast stimulation in the meantime and assisted with hand expression; she was able to easily get colostrum, praised her for her efforts. Reviewed feeding cues, infant feeding, lactogenesis II and size of baby's stomach.  Maternal Data Has patient been taught Hand Expression?: No Does the patient have breastfeeding experience prior to this delivery?: No  Feeding Mother's Current Feeding Choice: Breast Milk and Donor Milk Nipple Type: Nfant Slow Flow (purple)  Interventions Interventions: Breast feeding basics reviewed;Breast massage;Hand express;Education;"The NICU and Your Baby" book;LC Services brochure  Plan of care Encouraged mom to start putting baby to breast as soon as he gets in the room 8-12 times/24 hours or sooner if feeding cues are present Hand expression and breast massage were also encouraged  Mom's sister present and supportive. All questions and concerns answered, family to call NICU LC PRN.  Discharge Pump: DEBP;Personal (Spectra and Medela DEBP at home) [facility] Program: Yes  Consult Status Consult Status: Follow-up Date: 06/11/21 Follow-up type: In-patient   Cenia Zaragosa Venetia Constable 06/10/2021, 5:33 PM

## 2021-06-11 LAB — CBC
HCT: 26.7 % — ABNORMAL LOW (ref 36.0–46.0)
Hemoglobin: 8.8 g/dL — ABNORMAL LOW (ref 12.0–15.0)
MCH: 27.7 pg (ref 26.0–34.0)
MCHC: 33 g/dL (ref 30.0–36.0)
MCV: 84 fL (ref 80.0–100.0)
Platelets: 198 10*3/uL (ref 150–400)
RBC: 3.18 MIL/uL — ABNORMAL LOW (ref 3.87–5.11)
RDW: 12 % (ref 11.5–15.5)
WBC: 11.2 10*3/uL — ABNORMAL HIGH (ref 4.0–10.5)
nRBC: 0 % (ref 0.0–0.2)

## 2021-06-11 MED ORDER — FERROUS SULFATE 325 (65 FE) MG PO TABS
325.0000 mg | ORAL_TABLET | ORAL | Status: DC
  Administered 2021-06-11: 325 mg via ORAL
  Filled 2021-06-11: qty 1

## 2021-06-11 MED ORDER — DOCUSATE SODIUM 100 MG PO CAPS
100.0000 mg | ORAL_CAPSULE | Freq: Every day | ORAL | Status: DC
  Administered 2021-06-11: 100 mg via ORAL
  Filled 2021-06-11: qty 1

## 2021-06-11 NOTE — Lactation Note (Signed)
This note was copied from a baby's chart. Lactation Consultation Note  Patient Name: Melanie Mora [id number] Date: 06/11/2021 Reason for consult: Follow-up assessment Age:30 hours  P1, Baby latched and unlatched while LC in room. Baby has been cluster feeding.  Encouraged parents to rest in between feedings.  Mother states it has helped to hand express before latching.  Suggest calling if family needs further assistance.    LATCH Score Latch: Grasps breast easily, tongue down, lips flanged, rhythmical sucking.  Audible Swallowing: A few with stimulation  Type of Nipple: Everted at rest and after stimulation  Comfort (Breast/Nipple): Soft / non-tender  Hold (Positioning): Assistance needed to correctly position infant at breast and maintain latch.  LATCH Score: 8  Interventions Interventions: Breast feeding basics reviewed;Education  Consult Status Consult Status: Follow-up Date: 06/12/21 Follow-up type: In-patient    Vivianne Master [facility] 06/11/2021, 1:58 PM

## 2021-06-11 NOTE — Progress Notes (Signed)
Postpartum Progress Note  Post Partum Day 1 s/p spontaneous vaginal delivery.  Patient reports well-controlled pain, ambulating without difficulty, voiding spontaneously, tolerating PO.  Vaginal bleeding is appropriate.   Objective: Blood pressure 129/77, pulse 84, temperature 98.2 F (36.8 C), temperature source Oral, resp. rate 18, height 5\' 3"  (1.6 m), weight 91.6 kg, SpO2 100 %, unknown if currently breastfeeding.  Physical Exam:  General: alert and no distress Lochia: appropriate Uterine Fundus: firm DVT Evaluation: No evidence of DVT seen on physical exam.  Recent Labs    06/10/21 0540 06/11/21 0519  HGB 11.1* 8.8*  HCT 33.7* 26.7*    Assessment/Plan: Postpartum Day 1, s/p vaginal delivery. Continue routine postpartum care Acute blood loss anemia - Fe/Colace.  Lactation following Baby boy - declines circ.  Had brief stay in NICU yesterday for tachypnea, now w/ mother.  Anticipate discharge home later today or tomorrow.   LOS: 1 day   08/09/21 06/11/2021, 7:29 AM

## 2021-06-11 NOTE — Lactation Note (Signed)
This note was copied from a baby's chart. Lactation Consultation Note RN states baby has been BF well. Asked RN to call for [facility] when she sees mom awake/  Patient Name: Melanie Mora [id number] Date: 06/11/2021   Age:30 hours  Maternal Data    Feeding    LATCH Score                    Lactation Tools Discussed/Used    Interventions    Discharge    Consult Status      Charyl Dancer 06/11/2021, 4:44 AM

## 2021-06-11 NOTE — Anesthesia Postprocedure Evaluation (Signed)
Anesthesia Post Note  Patient: Melanie Mora  Procedure(s) Performed: AN AD HOC LABOR EPIDURAL     Patient location during evaluation: Mother Baby Anesthesia Type: Epidural Level of consciousness: awake Pain management: satisfactory to patient Vital Signs Assessment: post-procedure vital signs reviewed and stable Respiratory status: spontaneous breathing Cardiovascular status: stable Anesthetic complications: no   No notable events documented.  Last Vitals:  Vitals:   06/11/21 0100 06/11/21 0500  BP: 118/76 129/77  Pulse: 88 84  Resp: 19 18  Temp: 36.9 C 36.8 C  SpO2: 99% 100%    Last Pain:  Vitals:   06/11/21 0715  TempSrc:   PainSc: 0-No pain   Pain Goal: Patients Stated Pain Goal: 0 (06/10/21 0451)                 Casimer Lanius

## 2021-06-11 NOTE — Lactation Note (Signed)
This note was copied from a baby's chart. Lactation Consultation Note Mom requested to been seen on day by Lactation. Patient Name: Melanie Mora [id number] Date: 06/11/2021   Age:30 hours  Maternal Data    Feeding    LATCH Score                    Lactation Tools Discussed/Used    Interventions    Discharge    Consult Status      Charyl Dancer 06/11/2021, 5:25 AM

## 2021-06-12 MED ORDER — FERROUS SULFATE 325 (65 FE) MG PO TABS: 325.0000 mg | ORAL_TABLET | ORAL | 3 refills | Status: DC

## 2021-06-12 MED ORDER — FAMOTIDINE 20 MG PO TABS
10.0000 mg | ORAL_TABLET | Freq: Every day | ORAL | Status: DC
  Administered 2021-06-12: 10 mg via ORAL
  Filled 2021-06-12: qty 1

## 2021-06-12 MED ORDER — IBUPROFEN 600 MG PO TABS: 600.0000 mg | ORAL_TABLET | Freq: Four times a day (QID) | ORAL | 0 refills | Status: DC

## 2021-06-12 MED ORDER — DOCUSATE SODIUM 100 MG PO CAPS: 100.0000 mg | ORAL_CAPSULE | Freq: Every day | ORAL | 0 refills | Status: DC

## 2021-06-12 MED ORDER — ACETAMINOPHEN 325 MG PO TABS: 650.0000 mg | ORAL_TABLET | ORAL | 0 refills | Status: AC | PRN

## 2021-06-12 NOTE — Discharge Summary (Signed)
Obstetric Discharge Summary  Melanie Mora is a 31 y.o. female that presented on 06/10/2021 for contractions.  She was admitted to labor and delivery for labor.  Her labor course was uncomplicated and she delivered a viable female infant on 06/10/21.  Her postpartum course was uncomplicated and on PPD#2, she reported well controlled pain, spontaneous voiding, ambulating without difficulty, and tolerating PO.  She was stable for discharge home on 06/12/2021 with plans for in-office follow up.  Hemoglobin  Date Value Ref Range Status  06/11/2021 8.8 (L) 12.0 - 15.0 g/dL Final   HCT  Date Value Ref Range Status  06/11/2021 26.7 (L) 36.0 - 46.0 % Final    Physical Exam:  General: alert and no distress Lochia: appropriate Uterine Fundus: firm DVT Evaluation: No evidence of DVT seen on physical exam.  Discharge Diagnoses: Term Pregnancy-delivered  Discharge Information: Date: 06/12/2021 Activity: Pelvic rest, as tolerated Diet: routine Medications: Tylenol, motrin, iron sulfate, colace Condition: stable Instructions: Refer to practice specific booklet.  Discussed prior to discharge.  Discharge to: Home  Follow-up Information     [place] Follow up.   Why: Please follow up for 6 week postpartum visit. Contact information: [address] [place] [address] [phone]                 Newborn Data: Live born female  Birth Weight: 7 lb 13.2 oz (3550 g) APGAR: 6, 8  Newborn Delivery   Birth date/time: 06/10/2021 13:54:00 Delivery type: Vaginal, Spontaneous      Home with mother.  Lyn Henri 06/12/2021, 12:19 PM

## 2021-06-12 NOTE — Progress Notes (Signed)
Postpartum Progress Note  Post Partum Day 2 s/p spontaneous vaginal delivery.  Patient reports well-controlled pain, ambulating without difficulty, voiding spontaneously, tolerating PO.  Vaginal bleeding is appropriate.   Objective: Blood pressure 115/82, pulse 79, temperature 98.4 F (36.9 C), temperature source Oral, resp. rate 18, height 5\' 3"  (1.6 m), weight 91.6 kg, SpO2 98 %, unknown if currently breastfeeding.  Physical Exam:  General: alert and no distress Lochia: appropriate Uterine Fundus: firm DVT Evaluation: No evidence of DVT seen on physical exam.  Recent Labs    06/10/21 0540 06/11/21 0519  HGB 11.1* 8.8*  HCT 33.7* 26.7*     Assessment/Plan: Postpartum Day 2, s/p vaginal delivery. Continue routine postpartum care Acute blood loss anemia - Fe/Colace.  Lactation following Baby boy - declines circ.  Had brief stay in NICU for tachypnea, now w/ mother.  Anticipate discharge home later today.   LOS: 2 days   Carlyon Shadow 06/12/2021, 7:58 AM

## 2021-06-12 NOTE — Social Work (Signed)
CSW acknowledges consult for MOB scoring 12 on the Edinburgh Postpartum Depression Scale. ° °RN S. Burns contacted CSW and stated MOB declines to see CSW at this time. MOB reported to RN that she has been proactive in identifying mental health resources and that she has mental heath resources in place for postpartum follow-up. ° °CSW identifies no further need for intervention and no barriers to discharge at this time. ° °Kayren Holck, LCSWA °Clinical Social Work °[facility] °[phone] °

## 2021-06-12 NOTE — Lactation Note (Signed)
This note was copied from a baby's chart. Lactation Consultation Note  Patient Name: Melanie Mora [id number] Date: 06/12/2021 Reason for consult: Follow-up assessment;1st time breastfeeding;Primapara;Early term 37-38.6wks Age:30 hours  LC in to visit with P1 Mom of ET infant on day of discharge.  Baby at 6% weight loss and good output.  Mom tired from cluster feeding last night.  Mom has been able to hand express 5-10 ml colostrum to spoon or syringe feed to baby along with latching to the breast.  Mom aware of importance of a deep latch which Mom reports baby does.    Encouraged STS and feeding baby often with cues. Engorgement prevention and treatment reviewed. Encouraged Mom to call prn for concerns.  Mom aware of OP Lactation support. Ped appt 2/14.   Interventions Interventions: Breast feeding basics reviewed;Skin to skin;Breast massage;Hand express;Expressed milk;Hand pump  Discharge Discharge Education: Engorgement and breast care;Warning signs for feeding baby Pump: Personal  Consult Status Consult Status: Complete Date: 06/12/21 Follow-up type: Call as needed    [name] 06/12/2021, 10:56 AM

## 2021-06-16 ENCOUNTER — Inpatient Hospital Stay (HOSPITAL_COMMUNITY)
Admission: AD | Admit: 2021-06-16 | Payer: BC Managed Care – PPO | Source: Home / Self Care | Admitting: Obstetrics and Gynecology

## 2021-06-16 ENCOUNTER — Inpatient Hospital Stay (HOSPITAL_COMMUNITY): Payer: BC Managed Care – PPO

## 2021-06-17 DIAGNOSIS — M5489 Other dorsalgia: Secondary | ICD-10-CM | POA: Diagnosis not present

## 2021-06-20 ENCOUNTER — Telehealth (HOSPITAL_COMMUNITY): Payer: Self-pay | Admitting: *Deleted

## 2021-06-20 NOTE — Telephone Encounter (Signed)
Phone voicemail message left to return nurse call. EPDS=12 in hospital. Dr. Damaris Hippo notified via faxed score.  Duffy Rhody, RN 06-20-2021 at 4:05pm

## 2021-07-21 DIAGNOSIS — Z1389 Encounter for screening for other disorder: Secondary | ICD-10-CM | POA: Diagnosis not present

## 2021-08-04 DIAGNOSIS — Z3202 Encounter for pregnancy test, result negative: Secondary | ICD-10-CM | POA: Diagnosis not present

## 2021-08-04 DIAGNOSIS — Z30430 Encounter for insertion of intrauterine contraceptive device: Secondary | ICD-10-CM | POA: Diagnosis not present

## 2021-08-09 DIAGNOSIS — F840 Autistic disorder: Secondary | ICD-10-CM | POA: Diagnosis not present

## 2021-08-09 DIAGNOSIS — F411 Generalized anxiety disorder: Secondary | ICD-10-CM | POA: Diagnosis not present

## 2021-08-09 DIAGNOSIS — F341 Dysthymic disorder: Secondary | ICD-10-CM | POA: Diagnosis not present

## 2021-08-25 DIAGNOSIS — F341 Dysthymic disorder: Secondary | ICD-10-CM | POA: Diagnosis not present

## 2021-08-25 DIAGNOSIS — F411 Generalized anxiety disorder: Secondary | ICD-10-CM | POA: Diagnosis not present

## 2021-08-25 DIAGNOSIS — F840 Autistic disorder: Secondary | ICD-10-CM | POA: Diagnosis not present

## 2021-09-01 DIAGNOSIS — F411 Generalized anxiety disorder: Secondary | ICD-10-CM | POA: Diagnosis not present

## 2021-09-01 DIAGNOSIS — F840 Autistic disorder: Secondary | ICD-10-CM | POA: Diagnosis not present

## 2021-09-01 DIAGNOSIS — F341 Dysthymic disorder: Secondary | ICD-10-CM | POA: Diagnosis not present

## 2021-09-16 DIAGNOSIS — Z30431 Encounter for routine checking of intrauterine contraceptive device: Secondary | ICD-10-CM | POA: Diagnosis not present

## 2021-09-26 IMAGING — DX DG SACRUM/COCCYX 2+V
3 series · 3 of 3 positions shown · non-contrast
Comparison: None.

CLINICAL DATA: Fall on ice with sacral pain.  Initial encounter.

EXAM:
SACRUM AND COCCYX - 2+ VIEW

[t sacrum ap]
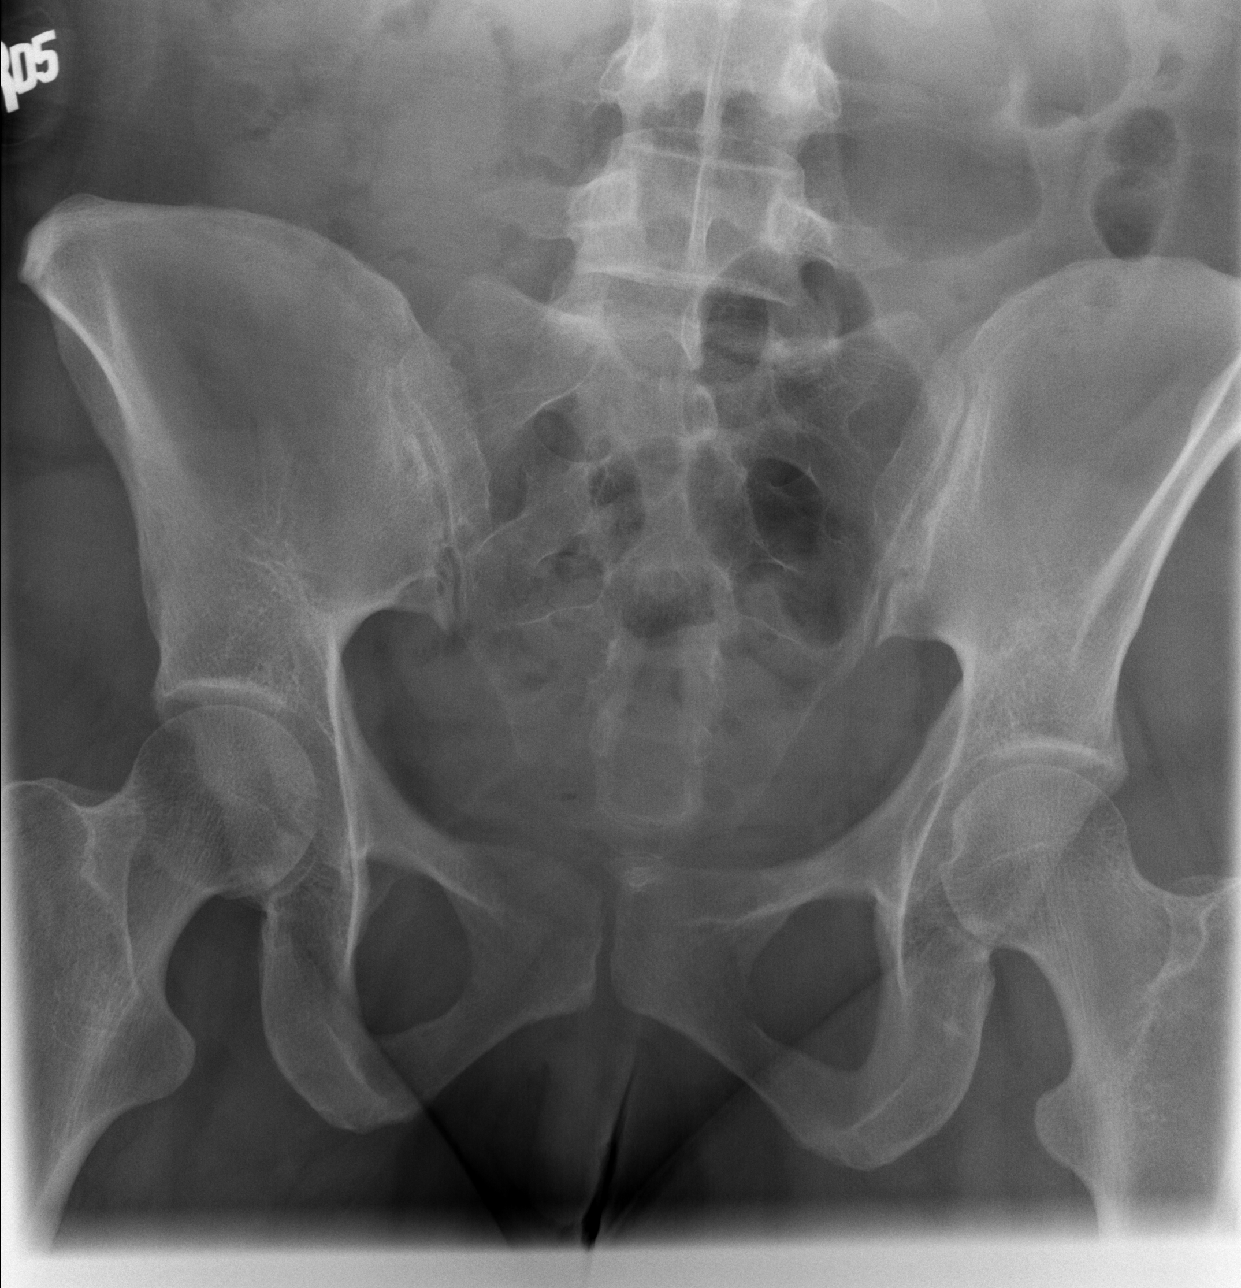

[t coccyx ap]
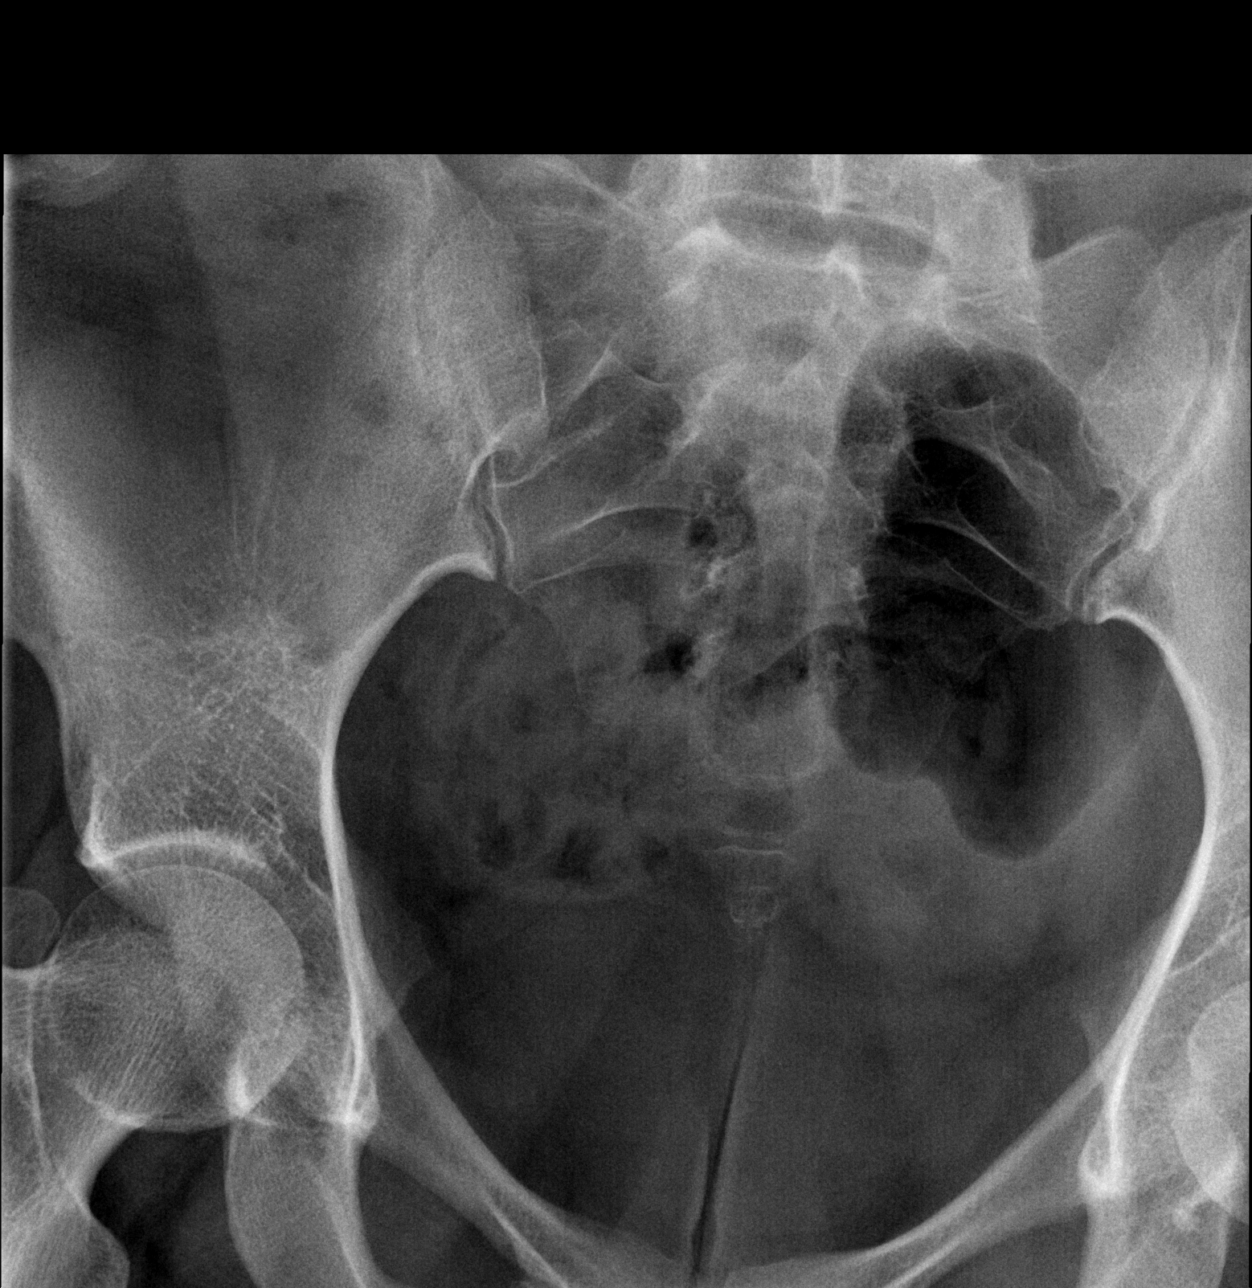

[t sacrum coccyx lat]
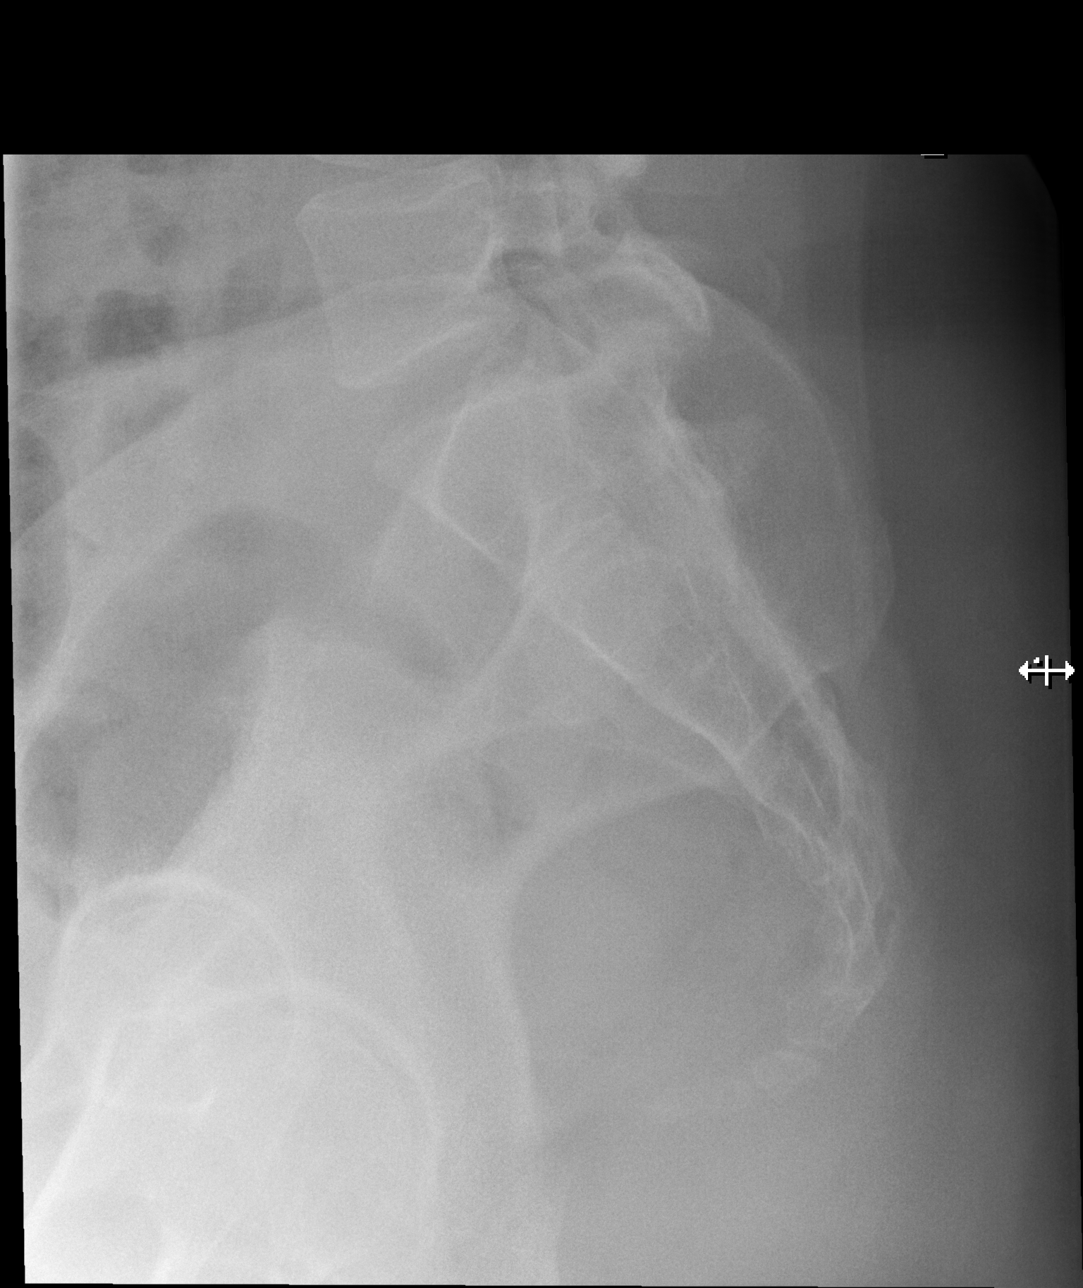

[3 of 3 positions shown; findings below may reference images not displayed]

FINDINGS: No acute fracture identified. The coccyx shows normal alignment in
the lateral view. Sacroiliac joints are symmetric and normal in
appearance bilaterally. No bony lesions identified.
IMPRESSION: Negative.

## 2022-01-29 DIAGNOSIS — R052 Subacute cough: Secondary | ICD-10-CM | POA: Diagnosis not present

## 2022-01-29 DIAGNOSIS — J029 Acute pharyngitis, unspecified: Secondary | ICD-10-CM | POA: Diagnosis not present

## 2022-01-29 DIAGNOSIS — H1031 Unspecified acute conjunctivitis, right eye: Secondary | ICD-10-CM | POA: Diagnosis not present

## 2022-02-03 DIAGNOSIS — F411 Generalized anxiety disorder: Secondary | ICD-10-CM | POA: Diagnosis not present

## 2022-02-03 DIAGNOSIS — F840 Autistic disorder: Secondary | ICD-10-CM | POA: Diagnosis not present

## 2022-02-03 DIAGNOSIS — F341 Dysthymic disorder: Secondary | ICD-10-CM | POA: Diagnosis not present

## 2022-02-24 DIAGNOSIS — F411 Generalized anxiety disorder: Secondary | ICD-10-CM | POA: Diagnosis not present

## 2022-02-24 DIAGNOSIS — Z1151 Encounter for screening for human papillomavirus (HPV): Secondary | ICD-10-CM | POA: Diagnosis not present

## 2022-02-24 DIAGNOSIS — Z124 Encounter for screening for malignant neoplasm of cervix: Secondary | ICD-10-CM | POA: Diagnosis not present

## 2022-02-24 DIAGNOSIS — F840 Autistic disorder: Secondary | ICD-10-CM | POA: Diagnosis not present

## 2022-02-24 DIAGNOSIS — F341 Dysthymic disorder: Secondary | ICD-10-CM | POA: Diagnosis not present

## 2022-02-24 DIAGNOSIS — Z01419 Encounter for gynecological examination (general) (routine) without abnormal findings: Secondary | ICD-10-CM | POA: Diagnosis not present

## 2022-02-24 DIAGNOSIS — Z6832 Body mass index (BMI) 32.0-32.9, adult: Secondary | ICD-10-CM | POA: Diagnosis not present

## 2022-03-10 DIAGNOSIS — F411 Generalized anxiety disorder: Secondary | ICD-10-CM | POA: Diagnosis not present

## 2022-03-10 DIAGNOSIS — F840 Autistic disorder: Secondary | ICD-10-CM | POA: Diagnosis not present

## 2022-03-10 DIAGNOSIS — F341 Dysthymic disorder: Secondary | ICD-10-CM | POA: Diagnosis not present

## 2022-03-31 DIAGNOSIS — F341 Dysthymic disorder: Secondary | ICD-10-CM | POA: Diagnosis not present

## 2022-03-31 DIAGNOSIS — F840 Autistic disorder: Secondary | ICD-10-CM | POA: Diagnosis not present

## 2022-03-31 DIAGNOSIS — F411 Generalized anxiety disorder: Secondary | ICD-10-CM | POA: Diagnosis not present

## 2022-04-14 DIAGNOSIS — F840 Autistic disorder: Secondary | ICD-10-CM | POA: Diagnosis not present

## 2022-04-14 DIAGNOSIS — F411 Generalized anxiety disorder: Secondary | ICD-10-CM | POA: Diagnosis not present

## 2022-04-14 DIAGNOSIS — F341 Dysthymic disorder: Secondary | ICD-10-CM | POA: Diagnosis not present

## 2022-05-01 NOTE — L&D Delivery Note (Signed)
Delivery Note At 2:30 PM a viable female was delivered via Vaginal, Spontaneous (Presentation:   Occiput Posterior).  APGAR: 8, 9; weight  pending.   Placenta status: Spontaneous, Intact.  Cord:   with the following complications: None.  Cord pH: not obtained   Anesthesia: Epidural Episiotomy:   Lacerations:  none Suture Repair:  not applicable Est. Blood Loss (mL):  300  Mom to postpartum.  Baby to Couplet care / Skin to Skin.  Jeani Hawking 03/09/2023, 3:09 PM

## 2022-05-05 DIAGNOSIS — Z30432 Encounter for removal of intrauterine contraceptive device: Secondary | ICD-10-CM | POA: Diagnosis not present

## 2022-05-15 DIAGNOSIS — F341 Dysthymic disorder: Secondary | ICD-10-CM | POA: Diagnosis not present

## 2022-05-15 DIAGNOSIS — F840 Autistic disorder: Secondary | ICD-10-CM | POA: Diagnosis not present

## 2022-05-15 DIAGNOSIS — F411 Generalized anxiety disorder: Secondary | ICD-10-CM | POA: Diagnosis not present

## 2022-05-20 DIAGNOSIS — R112 Nausea with vomiting, unspecified: Secondary | ICD-10-CM | POA: Diagnosis not present

## 2022-05-20 DIAGNOSIS — R1110 Vomiting, unspecified: Secondary | ICD-10-CM | POA: Diagnosis not present

## 2022-05-20 DIAGNOSIS — Z1152 Encounter for screening for COVID-19: Secondary | ICD-10-CM | POA: Diagnosis not present

## 2022-05-20 DIAGNOSIS — B349 Viral infection, unspecified: Secondary | ICD-10-CM | POA: Diagnosis not present

## 2022-05-20 DIAGNOSIS — R109 Unspecified abdominal pain: Secondary | ICD-10-CM | POA: Diagnosis not present

## 2022-06-26 ENCOUNTER — Ambulatory Visit: Payer: BC Managed Care – PPO | Admitting: Family

## 2022-06-26 ENCOUNTER — Encounter: Payer: Self-pay | Admitting: Family

## 2022-06-26 VITALS — BP 134/83 | HR 80 | Temp 97.6°F | Ht 62.0 in | Wt 186.8 lb

## 2022-06-26 DIAGNOSIS — F419 Anxiety disorder, unspecified: Secondary | ICD-10-CM

## 2022-06-26 DIAGNOSIS — F32A Depression, unspecified: Secondary | ICD-10-CM

## 2022-06-26 NOTE — Assessment & Plan Note (Signed)
chronic, stable established with Psych taking Prozac '20mg'$  and Bupoprion '300mg'$  qd no concerns today f/u prn

## 2022-06-26 NOTE — Progress Notes (Signed)
   New Patient Office Visit  Subjective:  Patient ID: Melanie Mora, female    DOB: 30-Nov-1990  Age: 32 y.o. MRN: [id number]  CC:  Chief Complaint  Patient presents with   New Patient (Initial Visit)   Anxiety/Depression    HPI Melanie Mora presents for establishing care today.  Anxiety and depression:   pt reports having sx and medications since middle school, and the latest Prozac and Bupropion for about 4 years. Also had been taking Gabapentin prior to pregnancy, but has not needed since delivering a year ago.   Assessment & Plan:   Problem List Items Addressed This Visit       Other   Anxiety and depression - Primary    chronic, stable established with Psych taking Prozac 27m and Bupoprion 3066mqd no concerns today f/u prn       Subjective:    Outpatient Medications Prior to Visit  Medication Sig Dispense Refill   acetaminophen (TYLENOL) 325 MG tablet Take 2 tablets (650 mg total) by mouth every 4 (four) hours as needed (for pain scale < 4). 30 tablet 0   buPROPion (WELLBUTRIN XL) 300 MG 24 hr tablet Take 300 mg by mouth daily.     Cholecalciferol 100 MCG (4000 UT) CAPS 2 capsules every day by oral route.     fexofenadine (ALLEGRA) 180 MG tablet Allegra Allergy     FLUoxetine (PROZAC) 20 MG capsule Take 20 mg by mouth daily.     Prenatal Vit-Fe Fumarate-FA (PRENATAL MULTIVITAMIN) TABS tablet Take 1 tablet by mouth daily at 12 noon.     Probiotic, Lactobacillus, CAPS Probiotic     ibuprofen (ADVIL) 600 MG tablet Take 1 tablet (600 mg total) by mouth every 6 (six) hours. 30 tablet 0   docusate sodium (COLACE) 100 MG capsule Take 1 capsule (100 mg total) by mouth daily. 10 capsule 0   ferrous sulfate 325 (65 FE) MG tablet Take 1 tablet (325 mg total) by mouth every other day. 30 tablet 3   No facility-administered medications prior to visit.   Past Medical History:  Diagnosis Date   Allergy    Pet dander   Anxiety 2008   Depression 2008   Normal labor  06/10/2021   Past Surgical History:  Procedure Laterality Date   CHOLECYSTECTOMY      Objective:   Today's Vitals: BP 134/83 (BP Location: Left Arm, Patient Position: Sitting, Cuff Size: Large)   Pulse 80   Temp 97.6 F (36.4 C) (Temporal)   Ht 5' 2"$  (1.575 m)   Wt 186 lb 12.8 oz (84.7 kg)   LMP  (LMP Unknown)   SpO2 94%   Breastfeeding No   BMI 34.17 kg/m   Physical Exam Vitals and nursing note reviewed.  Constitutional:      Appearance: Normal appearance.  Cardiovascular:     Rate and Rhythm: Normal rate and regular rhythm.  Pulmonary:     Effort: Pulmonary effort is normal.     Breath sounds: Normal breath sounds.  Musculoskeletal:        General: Normal range of motion.  Skin:    General: Skin is warm and dry.  Neurological:     Mental Status: She is alert.  Psychiatric:        Mood and Affect: Mood normal.        Behavior: Behavior normal.     HuJeanie SewerNP

## 2022-06-26 NOTE — Patient Instructions (Signed)
Welcome to [facility], It was a pleasure meeting you today!    You can schedule a physical with fasting labs at your convenience.  Have a great week!     PLEASE NOTE: If you had any LAB tests please let us know if you have not heard back within a few days. You may see your results on MyChart before we have a chance to review them but we will give you a call once they are reviewed by [place]. If we ordered any REFERRALS today, please let us know if you have not heard from their office within the next week.  Let us know through MyChart if you are needing REFILLS, or have your pharmacy send [place] the request. You can also use MyChart to communicate with me or any office staff.  Please try these tips to maintain a healthy lifestyle: It is important that you exercise regularly at least 30 minutes 5 times a week. Think about what you will eat, plan ahead. Choose whole foods, & think  "clean, green, fresh or frozen" over canned, processed or packaged foods which are more sugary, salty, and fatty. 70 to 75% of food eaten should be fresh vegetables and protein. 2-3  meals daily with healthy snacks between meals, but must be whole fruit, protein or vegetables. Aim to eat over a 10 hour period when you are active, for example, 7am to 5pm, and then STOP after your last meal of the day, drinking only water.  Shorter eating windows, 6-8 hours, are showing benefits in heart disease and blood sugar regulation. Drink water every day! Shoot for 64 ounces daily = 8 cups, no other drink is as healthy! Fruit juice is best enjoyed in a healthy way, by EATING the fruit.

## 2022-07-07 ENCOUNTER — Ambulatory Visit (INDEPENDENT_AMBULATORY_CARE_PROVIDER_SITE_OTHER): Payer: Self-pay | Admitting: Family

## 2022-07-07 ENCOUNTER — Encounter: Payer: Self-pay | Admitting: Family

## 2022-07-07 VITALS — BP 118/80 | HR 90 | Temp 97.8°F | Ht 62.0 in | Wt 187.1 lb

## 2022-07-07 DIAGNOSIS — Z0000 Encounter for general adult medical examination without abnormal findings: Secondary | ICD-10-CM

## 2022-07-07 LAB — CBC WITH DIFFERENTIAL/PLATELET
Basophils Absolute: 0.1 10*3/uL (ref 0.0–0.1)
Basophils Relative: 0.9 % (ref 0.0–3.0)
Eosinophils Absolute: 0.2 10*3/uL (ref 0.0–0.7)
Eosinophils Relative: 2.6 % (ref 0.0–5.0)
HCT: 40.3 % (ref 36.0–46.0)
Hemoglobin: 13.5 g/dL (ref 12.0–15.0)
Lymphocytes Relative: 26.5 % (ref 12.0–46.0)
Lymphs Abs: 1.7 10*3/uL (ref 0.7–4.0)
MCHC: 33.6 g/dL (ref 30.0–36.0)
MCV: 86.3 fl (ref 78.0–100.0)
Monocytes Absolute: 0.4 10*3/uL (ref 0.1–1.0)
Monocytes Relative: 6.7 % (ref 3.0–12.0)
Neutro Abs: 4.1 10*3/uL (ref 1.4–7.7)
Neutrophils Relative %: 63.3 % (ref 43.0–77.0)
Platelets: 314 10*3/uL (ref 150.0–400.0)
RBC: 4.67 Mil/uL (ref 3.87–5.11)
RDW: 13.8 % (ref 11.5–15.5)
WBC: 6.5 10*3/uL (ref 4.0–10.5)

## 2022-07-07 LAB — COMPREHENSIVE METABOLIC PANEL
ALT: 20 U/L (ref 0–35)
AST: 14 U/L (ref 0–37)
Albumin: 4.1 g/dL (ref 3.5–5.2)
Alkaline Phosphatase: 55 U/L (ref 39–117)
BUN: 7 mg/dL (ref 6–23)
CO2: 26 mEq/L (ref 19–32)
Calcium: 9.4 mg/dL (ref 8.4–10.5)
Chloride: 106 mEq/L (ref 96–112)
Creatinine, Ser: 0.72 mg/dL (ref 0.40–1.20)
GFR: 111.93 mL/min (ref >60.00)
Glucose, Bld: 89 mg/dL (ref 70–99)
Potassium: 4.6 mEq/L (ref 3.5–5.1)
Sodium: 139 mEq/L (ref 135–145)
Total Bilirubin: 1.2 mg/dL (ref 0.2–1.2)
Total Protein: 6.6 g/dL (ref 6.0–8.3)

## 2022-07-07 LAB — LIPID PANEL
Cholesterol: 176 mg/dL (ref 0–200)
HDL: 64.9 mg/dL (ref >39.00)
LDL Cholesterol: 99 mg/dL (ref 0–99)
NonHDL: 111.25
Total CHOL/HDL Ratio: 3
Triglycerides: 60 mg/dL (ref 0.0–149.0)
VLDL: 12 mg/dL (ref 0.0–40.0)

## 2022-07-07 LAB — TSH: TSH: 2.28 u[IU]/mL (ref 0.35–5.50)

## 2022-07-07 NOTE — Progress Notes (Signed)
Phone [phone]  Subjective:   Patient is a 32 y.o. female presenting for annual physical.    Chief Complaint  Patient presents with   Annual Exam    Fasting w/ Labs    See problem oriented charting- ROS- full  review of systems was completed and negative.  The following were reviewed and entered/updated in epic: Past Medical History:  Diagnosis Date   Allergy    Pet dander   Anxiety 2008   Depression 2008   Normal labor 06/10/2021   Patient Active Problem List   Diagnosis Date Noted   Anxiety and depression 06/26/2022   Past Surgical History:  Procedure Laterality Date   CHOLECYSTECTOMY      Family History  Problem Relation Age of Onset   ADD / ADHD Mother    Anxiety disorder Mother    Depression Mother    Miscarriages / [place] Mother    Stroke Mother    Vision loss Mother    Hypertension Father    Anxiety disorder Sister    Depression Sister    Anxiety disorder Sister    Depression Sister    Anxiety disorder Sister    Cancer Maternal Grandmother     Medications- reviewed and updated Current Outpatient Medications  Medication Sig Dispense Refill   acetaminophen (TYLENOL) 325 MG tablet Take 2 tablets (650 mg total) by mouth every 4 (four) hours as needed (for pain scale < 4). 30 tablet 0   buPROPion (WELLBUTRIN XL) 300 MG 24 hr tablet Take 300 mg by mouth daily.     Cholecalciferol 100 MCG (4000 UT) CAPS 2 capsules every day by oral route.     fexofenadine (ALLEGRA) 180 MG tablet Allegra Allergy     FLUoxetine (PROZAC) 20 MG capsule Take 20 mg by mouth daily.     Prenatal Vit-Fe Fumarate-FA (PRENATAL MULTIVITAMIN) TABS tablet Take 1 tablet by mouth daily at 12 noon.     Probiotic, Lactobacillus, CAPS Probiotic     No current facility-administered medications for this visit.    Allergies-reviewed and updated No Known Allergies  Social History   Social History Narrative   Not on file    Objective:  BP 118/80 (BP Location: Left Arm,  Patient Position: Sitting, Cuff Size: Normal)   Pulse 90   Temp 97.8 F (36.6 C) (Temporal)   Ht '5\' 2"'$  (1.575 m)   Wt 187 lb 2 oz (84.9 kg)   LMP  (LMP Unknown)   SpO2 100%   BMI 34.23 kg/m  Physical Exam Vitals and nursing note reviewed.  Constitutional:      Appearance: Normal appearance.  HENT:     Head: Normocephalic.     Right Ear: Tympanic membrane normal.     Left Ear: Tympanic membrane normal.     Nose: Nose normal.     Mouth/Throat:     Mouth: Mucous membranes are moist.  Eyes:     Pupils: Pupils are equal, round, and reactive to light.  Cardiovascular:     Rate and Rhythm: Normal rate and regular rhythm.  Pulmonary:     Effort: Pulmonary effort is normal.     Breath sounds: Normal breath sounds.  Musculoskeletal:        General: Normal range of motion.     Cervical back: Normal range of motion.  Lymphadenopathy:     Cervical: No cervical adenopathy.  Skin:    General: Skin is warm and dry.  Neurological:     Mental Status: She is alert.  Psychiatric:        Mood and Affect: Mood normal.        Behavior: Behavior normal.     Assessment and Plan   Health Maintenance counseling: 1. Anticipatory guidance: Patient counseled regarding regular dental exams q6 months, eye exams,  avoiding smoking and second hand smoke, limiting alcohol to 1 beverage per day, no illicit drugs.   2. Risk factor reduction:  Advised patient of need for regular exercise and diet rich with fruits and vegetables to reduce risk of heart attack and stroke. Wt Readings from Last 3 Encounters:  07/07/22 187 lb 2 oz (84.9 kg)  06/26/22 186 lb 12.8 oz (84.7 kg)  06/10/21 202 lb (91.6 kg)   3. Immunizations/screenings/ancillary studies Immunization History  Administered Date(s) Administered   Influenza-Unspecified 04/05/2020, 02/05/2021   Tdap 09/01/2018, 04/08/2021   Health Maintenance Due  Topic Date Due   COVID-19 Vaccine (1) Never done   PAP SMEAR-Modifier  Never done   INFLUENZA  VACCINE  11/29/2021    4. Cervical cancer screening: 2022 5. Skin cancer screening- advised regular sunscreen use. Denies worrisome, changing, or new skin lesions.  6. Birth control/STD check: none, pt is pregnant 7. Smoking associated screening: non-smoker 8. Alcohol screening: none   Annual physical exam -     Comprehensive metabolic panel -     TSH -     Lipid panel -     CBC with Differential/Platelet  Recommended follow up:  No follow-ups on file. No future appointments.  Lab/Order associations:fasting   Jeanie Sewer, NP

## 2022-07-07 NOTE — Patient Instructions (Signed)
It was very nice to see you today!   I will review your lab results via MyChart in a few days.  Congratulations on your pregnancy! Let me know if you need anything.  Have a great weekend!     PLEASE NOTE:  If you had any lab tests please let us know if you have not heard back within a few days. You may see your results on MyChart before we have a chance to review them but we will give you a call once they are reviewed by [place]. If we ordered any referrals today, please let us know if you have not heard from their office within the next week.

## 2022-08-04 DIAGNOSIS — N911 Secondary amenorrhea: Secondary | ICD-10-CM | POA: Diagnosis not present

## 2022-08-15 DIAGNOSIS — Z3481 Encounter for supervision of other normal pregnancy, first trimester: Secondary | ICD-10-CM | POA: Diagnosis not present

## 2022-08-15 DIAGNOSIS — Z3685 Encounter for antenatal screening for Streptococcus B: Secondary | ICD-10-CM | POA: Diagnosis not present

## 2022-08-15 DIAGNOSIS — Z3A10 10 weeks gestation of pregnancy: Secondary | ICD-10-CM | POA: Diagnosis not present

## 2022-08-15 LAB — OB RESULTS CONSOLE ANTIBODY SCREEN: Antibody Screen: NEGATIVE

## 2022-08-15 LAB — OB RESULTS CONSOLE RUBELLA ANTIBODY, IGM: Rubella: IMMUNE

## 2022-08-15 LAB — OB RESULTS CONSOLE RPR: RPR: NONREACTIVE

## 2022-08-15 LAB — HEPATITIS C ANTIBODY: HCV Ab: NEGATIVE

## 2022-08-15 LAB — OB RESULTS CONSOLE HEPATITIS B SURFACE ANTIGEN: Hepatitis B Surface Ag: NEGATIVE

## 2022-08-15 LAB — OB RESULTS CONSOLE HIV ANTIBODY (ROUTINE TESTING): HIV: NONREACTIVE

## 2022-08-18 DIAGNOSIS — Z113 Encounter for screening for infections with a predominantly sexual mode of transmission: Secondary | ICD-10-CM | POA: Diagnosis not present

## 2022-08-18 DIAGNOSIS — Z3400 Encounter for supervision of normal first pregnancy, unspecified trimester: Secondary | ICD-10-CM | POA: Diagnosis not present

## 2022-08-18 DIAGNOSIS — Z3A10 10 weeks gestation of pregnancy: Secondary | ICD-10-CM | POA: Diagnosis not present

## 2022-08-18 LAB — OB RESULTS CONSOLE GC/CHLAMYDIA
Chlamydia: NEGATIVE
Neisseria Gonorrhea: NEGATIVE

## 2022-10-18 DIAGNOSIS — Z3A19 19 weeks gestation of pregnancy: Secondary | ICD-10-CM | POA: Diagnosis not present

## 2022-10-18 DIAGNOSIS — Z361 Encounter for antenatal screening for raised alphafetoprotein level: Secondary | ICD-10-CM | POA: Diagnosis not present

## 2022-10-18 DIAGNOSIS — Z363 Encounter for antenatal screening for malformations: Secondary | ICD-10-CM | POA: Diagnosis not present

## 2022-11-08 DIAGNOSIS — F411 Generalized anxiety disorder: Secondary | ICD-10-CM | POA: Diagnosis not present

## 2022-11-10 DIAGNOSIS — F341 Dysthymic disorder: Secondary | ICD-10-CM | POA: Diagnosis not present

## 2022-11-10 DIAGNOSIS — F840 Autistic disorder: Secondary | ICD-10-CM | POA: Diagnosis not present

## 2022-11-10 DIAGNOSIS — F411 Generalized anxiety disorder: Secondary | ICD-10-CM | POA: Diagnosis not present

## 2022-12-13 DIAGNOSIS — Z3402 Encounter for supervision of normal first pregnancy, second trimester: Secondary | ICD-10-CM | POA: Diagnosis not present

## 2022-12-13 DIAGNOSIS — Z23 Encounter for immunization: Secondary | ICD-10-CM | POA: Diagnosis not present

## 2022-12-25 ENCOUNTER — Inpatient Hospital Stay (HOSPITAL_COMMUNITY)
Admission: AD | Admit: 2022-12-25 | Discharge: 2022-12-25 | Disposition: A | Discharge status: Home / Self Care | Payer: BC Managed Care – PPO | Source: Home / Self Care | Attending: Obstetrics and Gynecology | Admitting: Obstetrics and Gynecology

## 2022-12-25 ENCOUNTER — Encounter (HOSPITAL_COMMUNITY): Payer: Self-pay | Admitting: *Deleted

## 2022-12-25 ENCOUNTER — Other Ambulatory Visit: Payer: Self-pay

## 2022-12-25 DIAGNOSIS — O09293 Supervision of pregnancy with other poor reproductive or obstetric history, third trimester: Secondary | ICD-10-CM | POA: Diagnosis not present

## 2022-12-25 DIAGNOSIS — Z3A29 29 weeks gestation of pregnancy: Secondary | ICD-10-CM | POA: Insufficient documentation

## 2022-12-25 DIAGNOSIS — R42 Dizziness and giddiness: Secondary | ICD-10-CM | POA: Diagnosis not present

## 2022-12-25 DIAGNOSIS — O26893 Other specified pregnancy related conditions, third trimester: Secondary | ICD-10-CM | POA: Insufficient documentation

## 2022-12-25 LAB — CBC WITH DIFFERENTIAL/PLATELET
Abs Immature Granulocytes: 0.04 10*3/uL (ref 0.00–0.07)
Basophils Absolute: 0 10*3/uL (ref 0.0–0.1)
Basophils Relative: 0 %
Eosinophils Absolute: 0.1 10*3/uL (ref 0.0–0.5)
Eosinophils Relative: 2 %
HCT: 32.6 % — ABNORMAL LOW (ref 36.0–46.0)
Hemoglobin: 11 g/dL — ABNORMAL LOW (ref 12.0–15.0)
Immature Granulocytes: 1 %
Lymphocytes Relative: 19 %
Lymphs Abs: 1.6 10*3/uL (ref 0.7–4.0)
MCH: 28.9 pg (ref 26.0–34.0)
MCHC: 33.7 g/dL (ref 30.0–36.0)
MCV: 85.6 fL (ref 80.0–100.0)
Monocytes Absolute: 0.4 10*3/uL (ref 0.1–1.0)
Monocytes Relative: 5 %
Neutro Abs: 6 10*3/uL (ref 1.7–7.7)
Neutrophils Relative %: 73 %
Platelets: 279 10*3/uL (ref 150–400)
RBC: 3.81 MIL/uL — ABNORMAL LOW (ref 3.87–5.11)
RDW: 11.7 % (ref 11.5–15.5)
WBC: 8.1 10*3/uL (ref 4.0–10.5)
nRBC: 0 % (ref 0.0–0.2)

## 2022-12-25 LAB — URINALYSIS, ROUTINE W REFLEX MICROSCOPIC
Bilirubin Urine: NEGATIVE
Glucose, UA: NEGATIVE mg/dL
Hgb urine dipstick: NEGATIVE
Ketones, ur: 5 mg/dL — AB
Leukocytes,Ua: NEGATIVE
Nitrite: NEGATIVE
Protein, ur: NEGATIVE mg/dL
Specific Gravity, Urine: 1.008 (ref 1.005–1.030)
pH: 6 (ref 5.0–8.0)

## 2022-12-25 NOTE — MAU Provider Note (Signed)
History     CSN: [id number]  Arrival date and time: 12/25/22 1259   Event Date/Time   First Provider Initiated Contact with Patient 12/25/22 1512      No chief complaint on file.  HPI  Melanie Mora is a 32 y.o. female G2P1001 @ [redacted]w[redacted]d here after an episode of dizziness today. Around 8:00 am she ate chocolate chip pancakes and water. Around 0845 she got in the shower and started seeing black spots and felt dizzy. She laid down and slept for 2 hours after the episode.  When she woke up she ate a peanut butter and jelly sandwhich and canned pears. She continue to not feel well and came into MAU. She has a history of Preeclampsia with previous pregnancy and wanted to be sure her symptoms were not associated with new onset preeclampsia.   No HTN or diabetes. Passed her 2 hour GTT recently.   OB History     Gravida  2   Para  1   Term  1   Preterm      AB      Living  1      SAB      IAB      Ectopic      Multiple  0   Live Births  1           Past Medical History:  Diagnosis Date   Allergy    Pet dander   Anxiety 2008   Depression 2008   Normal labor 06/10/2021    Past Surgical History:  Procedure Laterality Date   CHOLECYSTECTOMY      Family History  Problem Relation Age of Onset   ADD / ADHD Mother    Anxiety disorder Mother    Depression Mother    Miscarriages / [place] Mother    Stroke Mother    Vision loss Mother    Hypertension Father    Anxiety disorder Sister    Depression Sister    Anxiety disorder Sister    Depression Sister    Anxiety disorder Sister    Cancer Maternal Grandmother     Social History   Tobacco Use   Smoking status: Never   Smokeless tobacco: Never  Vaping Use   Vaping status: Never Used  Substance Use Topics   Alcohol use: Not Currently    Comment: 0-1 a month   Drug use: Never    Allergies: No Known Allergies  Medications Prior to Admission  Medication Sig Dispense Refill Last Dose   aspirin 81  MG chewable tablet Chew 81 mg by mouth daily.   12/25/2022   buPROPion (WELLBUTRIN XL) 300 MG 24 hr tablet Take 300 mg by mouth daily.   12/25/2022   Cholecalciferol 100 MCG (4000 UT) CAPS 2 capsules every day by oral route.   12/25/2022   fexofenadine (ALLEGRA) 180 MG tablet Allegra Allergy   12/25/2022   FLUoxetine (PROZAC) 20 MG capsule Take 20 mg by mouth daily.   12/25/2022   Prenatal Vit-Fe Fumarate-FA (PRENATAL MULTIVITAMIN) TABS tablet Take 1 tablet by mouth daily at 12 noon.   12/25/2022   Probiotic, Lactobacillus, CAPS Probiotic   12/25/2022   acetaminophen (TYLENOL) 325 MG tablet Take 2 tablets (650 mg total) by mouth every 4 (four) hours as needed (for pain scale < 4). 30 tablet 0    Results for orders placed or performed during the hospital encounter of 12/25/22 (from the past 48 hour(s))  Urinalysis, Routine w reflex microscopic -Urine, Clean  Catch     Status: Abnormal   Collection Time: 12/25/22  1:37 PM  Result Value Ref Range   Color, Urine YELLOW YELLOW   APPearance CLEAR CLEAR   Specific Gravity, Urine 1.008 1.005 - 1.030   pH 6.0 5.0 - 8.0   Glucose, UA NEGATIVE NEGATIVE mg/dL   Hgb urine dipstick NEGATIVE NEGATIVE   Bilirubin Urine NEGATIVE NEGATIVE   Ketones, ur 5 (A) NEGATIVE mg/dL   Protein, ur NEGATIVE NEGATIVE mg/dL   Nitrite NEGATIVE NEGATIVE   Leukocytes,Ua NEGATIVE NEGATIVE    Comment: Performed at [facility], [address] [place], [place] [address]  CBC with Differential/Platelet     Status: Abnormal   Collection Time: 12/25/22  2:11 PM  Result Value Ref Range   WBC 8.1 4.0 - 10.5 K/uL   RBC 3.81 (L) 3.87 - 5.11 MIL/uL   Hemoglobin 11.0 (L) 12.0 - 15.0 g/dL   HCT 60.4 (L) 54.0 - 98.1 %   MCV 85.6 80.0 - 100.0 fL   MCH 28.9 26.0 - 34.0 pg   MCHC 33.7 30.0 - 36.0 g/dL   RDW 19.1 47.8 - 29.5 %   Platelets 279 150 - 400 K/uL   nRBC 0.0 0.0 - 0.2 %   Neutrophils Relative % 73 %   Neutro Abs 6.0 1.7 - 7.7 K/uL   Lymphocytes Relative 19 %   Lymphs  Abs 1.6 0.7 - 4.0 K/uL   Monocytes Relative 5 %   Monocytes Absolute 0.4 0.1 - 1.0 K/uL   Eosinophils Relative 2 %   Eosinophils Absolute 0.1 0.0 - 0.5 K/uL   Basophils Relative 0 %   Basophils Absolute 0.0 0.0 - 0.1 K/uL   Immature Granulocytes 1 %   Abs Immature Granulocytes 0.04 0.00 - 0.07 K/uL    Comment: Performed at [facility], [address] [place], [place] [address]    Review of Systems  Respiratory:  Negative for shortness of breath.   Gastrointestinal:  Negative for abdominal pain.  Neurological:  Positive for dizziness. Negative for syncope and speech difficulty.   Physical Exam   Blood pressure 110/71, pulse (!) 106, temperature 98.2 F (36.8 C), temperature source Oral, resp. rate 18, height 5\' 3"  (1.6 m), weight 86.8 kg, SpO2 98%, not currently breastfeeding.  Physical Exam Constitutional:      General: She is not in acute distress.    Appearance: She is well-developed. She is not ill-appearing, toxic-appearing or diaphoretic.  Skin:    General: Skin is warm.  Neurological:     Mental Status: She is alert and oriented to person, place, and time.  Psychiatric:        Behavior: Behavior normal.    Fetal Tracing:  Baseline: 125 bpm Variability: Moderate  Accelerations: 15x15 Decelerations: None  Toco:  None  MAU Course  Procedures  MDM  EKG normal sinus rhythm Orthostatic vitals reassuring.  Hgb stable @ 11  Assessment and Plan   1. Dizziness   2. [redacted] weeks gestation of pregnancy      P:  Discharge home Symptoms likely 2/2 to carbohydrate / sugar filled meals. Discussed adding more protein. Return to MAU if symptoms worsen Increase oral fluid intake  Jeremie Giangrande, Harolyn Rutherford, NP 12/25/2022 6:57 PM

## 2022-12-25 NOTE — MAU Note (Signed)
Melanie Mora is a 32 y.o. at [redacted]w[redacted]d here in MAU reporting: while taking a shower became close to fainting and is feeling dizzy and SOB.  States had Pre Eclampsia with previous pregnancy, concerned may be developing with this one too.  Denies current HA or epigastric pain, has had intermittent visual disturbances.   Denies VB or LOF.  Endorses +FM. Reports increased anxiety, currently on meds for depression but not anxiety.  Not currently taking meds for anxiety.  Had appt to be seen today, cancelled to be seen in MAU.  Denies SI. LMP: NA Onset of complaint: today Pain score: 0 Vitals:   12/25/22 1318  BP: 110/71  Pulse: (!) 106  Resp: 18  Temp: 98.2 F (36.8 C)  SpO2: 98%     FHT:130 bpm Lab orders placed from triage:   UA

## 2022-12-26 DIAGNOSIS — F339 Major depressive disorder, recurrent, unspecified: Secondary | ICD-10-CM | POA: Diagnosis not present

## 2022-12-26 DIAGNOSIS — F900 Attention-deficit hyperactivity disorder, predominantly inattentive type: Secondary | ICD-10-CM | POA: Diagnosis not present

## 2022-12-26 DIAGNOSIS — F411 Generalized anxiety disorder: Secondary | ICD-10-CM | POA: Diagnosis not present

## 2023-01-29 ENCOUNTER — Encounter (HOSPITAL_COMMUNITY): Payer: Self-pay | Admitting: Obstetrics and Gynecology

## 2023-01-29 ENCOUNTER — Inpatient Hospital Stay (HOSPITAL_COMMUNITY)
Admission: AD | Admit: 2023-01-29 | Discharge: 2023-01-29 | Disposition: A | Discharge status: Home / Self Care | Payer: BC Managed Care – PPO | Attending: Obstetrics and Gynecology | Admitting: Obstetrics and Gynecology

## 2023-01-29 DIAGNOSIS — R1030 Lower abdominal pain, unspecified: Secondary | ICD-10-CM | POA: Diagnosis not present

## 2023-01-29 DIAGNOSIS — O9A213 Injury, poisoning and certain other consequences of external causes complicating pregnancy, third trimester: Secondary | ICD-10-CM | POA: Diagnosis not present

## 2023-01-29 DIAGNOSIS — M25551 Pain in right hip: Secondary | ICD-10-CM | POA: Diagnosis not present

## 2023-01-29 DIAGNOSIS — Z3A34 34 weeks gestation of pregnancy: Secondary | ICD-10-CM | POA: Diagnosis not present

## 2023-01-29 DIAGNOSIS — W19XXXA Unspecified fall, initial encounter: Secondary | ICD-10-CM | POA: Diagnosis not present

## 2023-01-29 DIAGNOSIS — S060X0A Concussion without loss of consciousness, initial encounter: Secondary | ICD-10-CM

## 2023-01-29 DIAGNOSIS — Y939 Activity, unspecified: Secondary | ICD-10-CM | POA: Diagnosis not present

## 2023-01-29 DIAGNOSIS — S0990XA Unspecified injury of head, initial encounter: Secondary | ICD-10-CM | POA: Insufficient documentation

## 2023-01-29 DIAGNOSIS — R110 Nausea: Secondary | ICD-10-CM | POA: Insufficient documentation

## 2023-01-29 DIAGNOSIS — W1839XA Other fall on same level, initial encounter: Secondary | ICD-10-CM | POA: Diagnosis not present

## 2023-01-29 DIAGNOSIS — R519 Headache, unspecified: Secondary | ICD-10-CM | POA: Insufficient documentation

## 2023-01-29 MED ORDER — ONDANSETRON 4 MG PO TBDP: 4.0000 mg | ORAL_TABLET | Freq: Three times a day (TID) | ORAL | 0 refills | Status: DC | PRN

## 2023-01-29 MED ORDER — HYDROXYZINE HCL 25 MG PO TABS: 25.0000 mg | ORAL_TABLET | Freq: Every evening | ORAL | 0 refills | Status: DC

## 2023-01-29 NOTE — MAU Provider Note (Addendum)
History    Chief Complaint  Patient presents with   Fall   Headache   Groin Pain   HPI Melanie Mora is a G2P1001 at [redacted]w[redacted]d reporting to the MAU for a fall on Friday (9/27). She states that she was on her porch and slipped and hit the right side of her head on the grill and right hip on the porch. She denies LOC. She has been experiencing headaches since the fall that normally go away on their own throughout the day. She states that she has started to feel nauseous with walking and groin pain since the fall and has not taken any medications for it. She also states she has recently had issues with sleeping. She continues to feel baby move, denies VB and LOF.   OB History     Gravida  2   Para  1   Term  1   Preterm      AB      Living  1      SAB      IAB      Ectopic      Multiple  0   Live Births  1           Past Medical History:  Diagnosis Date   Allergy    Pet dander   Anxiety 2008   Depression 2008   Normal labor 06/10/2021    Past Surgical History:  Procedure Laterality Date   CHOLECYSTECTOMY      Family History  Problem Relation Age of Onset   ADD / ADHD Mother    Anxiety disorder Mother    Depression Mother    Miscarriages / [place] Mother    Stroke Mother    Vision loss Mother    Hypertension Father    Anxiety disorder Sister    Depression Sister    Anxiety disorder Sister    Depression Sister    Anxiety disorder Sister    Cancer Maternal Grandmother     Social History   Tobacco Use   Smoking status: Never   Smokeless tobacco: Never  Vaping Use   Vaping status: Never Used  Substance Use Topics   Alcohol use: Not Currently    Comment: 0-1 a month   Drug use: Never    Allergies: No Known Allergies  Medications Prior to Admission  Medication Sig Dispense Refill Last Dose   acetaminophen (TYLENOL) 325 MG tablet Take 2 tablets (650 mg total) by mouth every 4 (four) hours as needed (for pain scale < 4). 30 tablet 0  01/28/2023   aspirin 81 MG chewable tablet Chew 81 mg by mouth daily.   01/28/2023   buPROPion (WELLBUTRIN XL) 300 MG 24 hr tablet Take 300 mg by mouth daily.   01/28/2023   Cholecalciferol 100 MCG (4000 UT) CAPS 2 capsules every day by oral route.   01/28/2023   fexofenadine (ALLEGRA) 180 MG tablet Allegra Allergy   01/28/2023   FLUoxetine (PROZAC) 20 MG capsule Take 20 mg by mouth daily.   01/28/2023   LORazepam (ATIVAN) 0.5 MG tablet Take 0.5 mg by mouth every 8 (eight) hours.   Past Week   Prenatal Vit-Fe Fumarate-FA (PRENATAL MULTIVITAMIN) TABS tablet Take 1 tablet by mouth daily at 12 noon.   01/28/2023   Probiotic, Lactobacillus, CAPS Probiotic       Review of Systems  Genitourinary:  Positive for pelvic pain. Negative for difficulty urinating, dysuria, hematuria, vaginal bleeding and vaginal discharge.  Musculoskeletal:  Positive  for myalgias.  Neurological:  Positive for headaches. Negative for syncope and numbness.   Physical Exam   Blood pressure 111/78, pulse 88, temperature 98.1 F (36.7 C), temperature source Oral, resp. rate 17, height 5\' 3"  (1.6 m), weight 89.9 kg, SpO2 98%, not currently breastfeeding.  Physical Exam Constitutional:      General: She is not in acute distress.    Appearance: She is not ill-appearing.  HENT:     Head: Normocephalic and atraumatic.  Eyes:     Extraocular Movements: Extraocular movements intact.     Right eye: No nystagmus.     Left eye: No nystagmus.     Pupils: Pupils are equal, round, and reactive to light.  Cardiovascular:     Rate and Rhythm: Normal rate and regular rhythm.  Pulmonary:     Effort: Pulmonary effort is normal.  Abdominal:     Tenderness: There is no abdominal tenderness.     Comments: Gravid  Musculoskeletal:        General: Tenderness present. No swelling.     Cervical back: Normal range of motion.     Comments: Tenderness to palpation R groin. No bruising, swelling visible. Full ROM  Skin:    General: Skin is  warm and dry.  Neurological:     Mental Status: She is alert and oriented to person, place, and time.     Cranial Nerves: No cranial nerve deficit, dysarthria or facial asymmetry.     Sensory: No sensory deficit.     Motor: No weakness.     Coordination: Coordination normal.     Comments: Speech appears slow but not slurred  Psychiatric:        Mood and Affect: Mood normal.    FWB: FHT 115/moderate variability/+accels/decels. No ctx  MAU Course   Patient presents with chief complaint of a fall. Fetal tracing reactive. General physical and evaluation performed, focusing on hip and headache/neurologic status. Patient declines any tylenol/muscle relaxers for treatment of pain.  Assessment and Plan  Melanie Mora is a 32y.o G2P1001 at [redacted]w[redacted]d who reports to the MAU for a fall.  #Fall: Patient now has symptoms consistent with concussion (headache, nausea after head injury). Has no focal deficits or LOC to warrant head imaging at this time. Discussed expected recovery course and encouraged rest. Treat symptomatically as below: - Tylenol for headache - Zofran for nausea - hydroxyzine for sleep - Work note provided  #R hip pain: Suspect groin strain. No bruising, swelling to suggest fracture and is additionally able to ambulate on her own. Discussed getting X-ray with patient if she desired to confirm. Patient declined. - Tylenol, heat/ice for pain  Note prepared by Nehemiah Settle, [place] student  Joanne Gavel, MD 01/29/2023, 2:43 PM

## 2023-01-29 NOTE — Discharge Instructions (Signed)
Please use tylenol, heat/ice for pain. Zofran is helpful with nausea. Hydroxyzine for sleep. Please take it easy while your body is healing.

## 2023-01-29 NOTE — MAU Note (Signed)
Melanie Mora is a 32 y.o. at [redacted]w[redacted]d here in MAU reporting: on Friday, slipped an fell on the back porch.  Hit her head on the grill, legs went in different direction. Feels like she pulled something and it is making it hard to walk. Had a headache for half the day, so thought she was probably ok.  Has had a concussion in the past.  Wakes up every morning with a headache.  Nauseous when walking for more than a few minutes.  Hard to lift things without pain. Pain is in her groin. "Lightening crotch - sharp shooting every time she walks or lifts her legs". No bleeding or leaking. Reports +FM  Onset of complaint: Friday Pain score: HA 3, groin rt Vitals:   01/29/23 1239  BP: 113/76  Pulse: 97  Resp: 17  Temp: 98.1 F (36.7 C)  SpO2: 98%     FHT:120 Lab orders placed from triage:  urine collected.

## 2023-02-07 DIAGNOSIS — O99323 Drug use complicating pregnancy, third trimester: Secondary | ICD-10-CM | POA: Diagnosis not present

## 2023-02-07 DIAGNOSIS — Z3A35 35 weeks gestation of pregnancy: Secondary | ICD-10-CM | POA: Diagnosis not present

## 2023-02-07 DIAGNOSIS — Z23 Encounter for immunization: Secondary | ICD-10-CM | POA: Diagnosis not present

## 2023-02-14 DIAGNOSIS — Z3685 Encounter for antenatal screening for Streptococcus B: Secondary | ICD-10-CM | POA: Diagnosis not present

## 2023-02-14 LAB — OB RESULTS CONSOLE GBS: GBS: NEGATIVE

## 2023-03-08 ENCOUNTER — Telehealth (HOSPITAL_COMMUNITY): Payer: Self-pay | Admitting: *Deleted

## 2023-03-08 ENCOUNTER — Encounter (HOSPITAL_COMMUNITY): Payer: Self-pay | Admitting: *Deleted

## 2023-03-08 NOTE — Telephone Encounter (Signed)
Preadmission screen  

## 2023-03-09 ENCOUNTER — Encounter (HOSPITAL_COMMUNITY): Payer: Self-pay | Admitting: Obstetrics and Gynecology

## 2023-03-09 ENCOUNTER — Inpatient Hospital Stay (HOSPITAL_COMMUNITY)
Admission: RE | Admit: 2023-03-09 | Discharge: 2023-03-10 | DRG: 807 | Disposition: A | Discharge status: Home / Self Care | Payer: BC Managed Care – PPO | Attending: Obstetrics and Gynecology | Admitting: Obstetrics and Gynecology

## 2023-03-09 ENCOUNTER — Inpatient Hospital Stay (HOSPITAL_COMMUNITY): Payer: BC Managed Care – PPO

## 2023-03-09 ENCOUNTER — Inpatient Hospital Stay (HOSPITAL_COMMUNITY): Payer: BC Managed Care – PPO | Admitting: Anesthesiology

## 2023-03-09 DIAGNOSIS — Z823 Family history of stroke: Secondary | ICD-10-CM

## 2023-03-09 DIAGNOSIS — Z818 Family history of other mental and behavioral disorders: Secondary | ICD-10-CM

## 2023-03-09 DIAGNOSIS — O139 Gestational [pregnancy-induced] hypertension without significant proteinuria, unspecified trimester: Principal | ICD-10-CM | POA: Diagnosis present

## 2023-03-09 DIAGNOSIS — Z821 Family history of blindness and visual loss: Secondary | ICD-10-CM | POA: Diagnosis not present

## 2023-03-09 DIAGNOSIS — O134 Gestational [pregnancy-induced] hypertension without significant proteinuria, complicating childbirth: Principal | ICD-10-CM | POA: Diagnosis present

## 2023-03-09 DIAGNOSIS — Z8249 Family history of ischemic heart disease and other diseases of the circulatory system: Secondary | ICD-10-CM

## 2023-03-09 DIAGNOSIS — Z98891 History of uterine scar from previous surgery: Secondary | ICD-10-CM

## 2023-03-09 DIAGNOSIS — Z23 Encounter for immunization: Secondary | ICD-10-CM | POA: Diagnosis not present

## 2023-03-09 DIAGNOSIS — Z3A39 39 weeks gestation of pregnancy: Secondary | ICD-10-CM | POA: Diagnosis not present

## 2023-03-09 DIAGNOSIS — R011 Cardiac murmur, unspecified: Secondary | ICD-10-CM | POA: Diagnosis not present

## 2023-03-09 LAB — CBC
HCT: 35.5 % — ABNORMAL LOW (ref 36.0–46.0)
Hemoglobin: 12 g/dL (ref 12.0–15.0)
MCH: 28.1 pg (ref 26.0–34.0)
MCHC: 33.8 g/dL (ref 30.0–36.0)
MCV: 83.1 fL (ref 80.0–100.0)
Platelets: 178 10*3/uL (ref 150–400)
RBC: 4.27 MIL/uL (ref 3.87–5.11)
RDW: 15.2 % (ref 11.5–15.5)
WBC: 6.2 10*3/uL (ref 4.0–10.5)
nRBC: 0 % (ref 0.0–0.2)

## 2023-03-09 LAB — RPR: RPR Ser Ql: NONREACTIVE

## 2023-03-09 LAB — TYPE AND SCREEN
ABO/RH(D): B POS
Antibody Screen: NEGATIVE

## 2023-03-09 MED ORDER — COCONUT OIL OIL: 1.0000 | TOPICAL_OIL | Status: DC | PRN

## 2023-03-09 MED ORDER — ONDANSETRON HCL 4 MG/2ML IJ SOLN
4.0000 mg | Freq: Four times a day (QID) | INTRAMUSCULAR | Status: DC | PRN
Start: 2023-03-09 — End: 2023-03-09

## 2023-03-09 MED ORDER — OXYCODONE-ACETAMINOPHEN 5-325 MG PO TABS: 1.0000 | ORAL_TABLET | ORAL | Status: DC | PRN

## 2023-03-09 MED ORDER — MEDROXYPROGESTERONE ACETATE 150 MG/ML IM SUSP: 150.0000 mg | INTRAMUSCULAR | Status: DC | PRN

## 2023-03-09 MED ORDER — SENNOSIDES-DOCUSATE SODIUM 8.6-50 MG PO TABS
2.0000 | ORAL_TABLET | Freq: Every day | ORAL | Status: DC
Start: 2023-03-10 — End: 2023-03-10
  Administered 2023-03-10: 2 via ORAL
  Filled 2023-03-09: qty 2

## 2023-03-09 MED ORDER — LIDOCAINE HCL (PF) 1 % IJ SOLN: 30.0000 mL | INTRAMUSCULAR | Status: DC | PRN

## 2023-03-09 MED ORDER — MEASLES, MUMPS & RUBELLA VAC IJ SOLR
0.5000 mL | Freq: Once | INTRAMUSCULAR | Status: DC
Start: 2023-03-10 — End: 2023-03-10

## 2023-03-09 MED ORDER — FLEET ENEMA RE ENEM: 1.0000 | ENEMA | Freq: Every day | RECTAL | Status: DC | PRN

## 2023-03-09 MED ORDER — WITCH HAZEL-GLYCERIN EX PADS: 1.0000 | MEDICATED_PAD | CUTANEOUS | Status: DC | PRN

## 2023-03-09 MED ORDER — PHENYLEPHRINE 80 MCG/ML (10ML) SYRINGE FOR IV PUSH (FOR BLOOD PRESSURE SUPPORT): 80.0000 ug | PREFILLED_SYRINGE | INTRAVENOUS | Status: DC | PRN

## 2023-03-09 MED ORDER — TERBUTALINE SULFATE 1 MG/ML IJ SOLN
0.2500 mg | Freq: Once | INTRAMUSCULAR | Status: DC | PRN
Start: 2023-03-09 — End: 2023-03-09

## 2023-03-09 MED ORDER — IBUPROFEN 600 MG PO TABS: 600.0000 mg | ORAL_TABLET | Freq: Four times a day (QID) | ORAL | Status: DC

## 2023-03-09 MED ORDER — ONDANSETRON HCL 4 MG PO TABS
4.0000 mg | ORAL_TABLET | ORAL | Status: DC | PRN
  Administered 2023-03-09: 4 mg via ORAL
  Filled 2023-03-09: qty 1

## 2023-03-09 MED ORDER — DIPHENHYDRAMINE HCL 50 MG/ML IJ SOLN: 12.5000 mg | INTRAMUSCULAR | Status: DC | PRN

## 2023-03-09 MED ORDER — DIBUCAINE (PERIANAL) 1 % EX OINT: 1.0000 | TOPICAL_OINTMENT | CUTANEOUS | Status: DC | PRN

## 2023-03-09 MED ORDER — SIMETHICONE 80 MG PO CHEW: 80.0000 mg | CHEWABLE_TABLET | ORAL | Status: DC | PRN

## 2023-03-09 MED ORDER — LACTATED RINGERS IV SOLN: INTRAVENOUS | Status: DC

## 2023-03-09 MED ORDER — FENTANYL-BUPIVACAINE-NACL 0.5-0.125-0.9 MG/250ML-% EP SOLN
12.0000 mL/h | EPIDURAL | Status: DC | PRN
  Administered 2023-03-09: 12 mL/h via EPIDURAL
  Filled 2023-03-09: qty 250

## 2023-03-09 MED ORDER — EPHEDRINE 5 MG/ML INJ: 10.0000 mg | INTRAVENOUS | Status: DC | PRN

## 2023-03-09 MED ORDER — SOD CITRATE-CITRIC ACID 500-334 MG/5ML PO SOLN
30.0000 mL | ORAL | Status: DC | PRN
Start: 2023-03-09 — End: 2023-03-09

## 2023-03-09 MED ORDER — OXYCODONE-ACETAMINOPHEN 5-325 MG PO TABS: 2.0000 | ORAL_TABLET | ORAL | Status: DC | PRN

## 2023-03-09 MED ORDER — OXYTOCIN-SODIUM CHLORIDE 30-0.9 UT/500ML-% IV SOLN
2.5000 [IU]/h | INTRAVENOUS | Status: DC
Start: 2023-03-09 — End: 2023-03-09

## 2023-03-09 MED ORDER — PRENATAL MULTIVITAMIN CH: 1.0000 | ORAL_TABLET | Freq: Every day | ORAL | Status: DC

## 2023-03-09 MED ORDER — OXYCODONE HCL 5 MG PO TABS: 5.0000 mg | ORAL_TABLET | ORAL | Status: DC | PRN

## 2023-03-09 MED ORDER — MENTHOL 3 MG MT LOZG: 1.0000 | LOZENGE | OROMUCOSAL | Status: DC | PRN

## 2023-03-09 MED ORDER — ACETAMINOPHEN 325 MG PO TABS: 650.0000 mg | ORAL_TABLET | ORAL | Status: DC | PRN

## 2023-03-09 MED ORDER — ZOLPIDEM TARTRATE 5 MG PO TABS: 5.0000 mg | ORAL_TABLET | Freq: Every evening | ORAL | Status: DC | PRN

## 2023-03-09 MED ORDER — ZOLPIDEM TARTRATE 5 MG PO TABS
5.0000 mg | ORAL_TABLET | Freq: Every evening | ORAL | Status: DC | PRN
Start: 2023-03-09 — End: 2023-03-09

## 2023-03-09 MED ORDER — ONDANSETRON HCL 4 MG/2ML IJ SOLN: 4.0000 mg | INTRAMUSCULAR | Status: DC | PRN

## 2023-03-09 MED ORDER — OXYTOCIN-SODIUM CHLORIDE 30-0.9 UT/500ML-% IV SOLN
1.0000 m[IU]/min | INTRAVENOUS | Status: DC
  Filled 2023-03-09: qty 500

## 2023-03-09 MED ORDER — PRENATAL MULTIVITAMIN CH
1.0000 | ORAL_TABLET | Freq: Every day | ORAL | Status: DC
  Administered 2023-03-10: 1 via ORAL
  Filled 2023-03-09: qty 1

## 2023-03-09 MED ORDER — ACETAMINOPHEN 325 MG PO TABS
650.0000 mg | ORAL_TABLET | ORAL | Status: DC | PRN
  Administered 2023-03-10: 650 mg via ORAL
  Filled 2023-03-09: qty 2

## 2023-03-09 MED ORDER — BENZOCAINE-MENTHOL 20-0.5 % EX AERO: 1.0000 | INHALATION_SPRAY | CUTANEOUS | Status: DC | PRN

## 2023-03-09 MED ORDER — OXYTOCIN-SODIUM CHLORIDE 30-0.9 UT/500ML-% IV SOLN
2.5000 [IU]/h | INTRAVENOUS | Status: DC
  Administered 2023-03-09: 2.5 [IU]/h via INTRAVENOUS

## 2023-03-09 MED ORDER — SIMETHICONE 80 MG PO CHEW: 80.0000 mg | CHEWABLE_TABLET | Freq: Three times a day (TID) | ORAL | Status: DC

## 2023-03-09 MED ORDER — HYDROMORPHONE HCL 2 MG PO TABS: 2.0000 mg | ORAL_TABLET | ORAL | Status: DC | PRN

## 2023-03-09 MED ORDER — BISACODYL 10 MG RE SUPP: 10.0000 mg | Freq: Every day | RECTAL | Status: DC | PRN

## 2023-03-09 MED ORDER — FENTANYL-BUPIVACAINE-NACL 0.5-0.125-0.9 MG/250ML-% EP SOLN: 12.0000 mL/h | EPIDURAL | Status: DC | PRN

## 2023-03-09 MED ORDER — DIPHENHYDRAMINE HCL 25 MG PO CAPS
25.0000 mg | ORAL_CAPSULE | Freq: Four times a day (QID) | ORAL | Status: DC | PRN
Start: 2023-03-09 — End: 2023-03-10

## 2023-03-09 MED ORDER — SENNOSIDES-DOCUSATE SODIUM 8.6-50 MG PO TABS: 2.0000 | ORAL_TABLET | Freq: Every day | ORAL | Status: DC

## 2023-03-09 MED ORDER — LACTATED RINGERS IV SOLN
500.0000 mL | Freq: Once | INTRAVENOUS | Status: DC
  Administered 2023-03-09: 500 mL via INTRAVENOUS

## 2023-03-09 MED ORDER — DIPHENHYDRAMINE HCL 25 MG PO CAPS: 25.0000 mg | ORAL_CAPSULE | Freq: Four times a day (QID) | ORAL | Status: DC | PRN

## 2023-03-09 MED ORDER — LIDOCAINE HCL (PF) 1 % IJ SOLN
INTRAMUSCULAR | Status: DC | PRN
  Administered 2023-03-09: 8 mL via EPIDURAL

## 2023-03-09 MED ORDER — OXYCODONE HCL 5 MG PO TABS: 10.0000 mg | ORAL_TABLET | ORAL | Status: DC | PRN

## 2023-03-09 MED ORDER — OXYTOCIN BOLUS FROM INFUSION
333.0000 mL | Freq: Once | INTRAVENOUS | Status: DC
  Administered 2023-03-09: 333 mL via INTRAVENOUS

## 2023-03-09 MED ORDER — TETANUS-DIPHTH-ACELL PERTUSSIS 5-2.5-18.5 LF-MCG/0.5 IM SUSY: 0.5000 mL | PREFILLED_SYRINGE | Freq: Once | INTRAMUSCULAR | Status: DC

## 2023-03-09 MED ORDER — IBUPROFEN 600 MG PO TABS
600.0000 mg | ORAL_TABLET | Freq: Four times a day (QID) | ORAL | Status: DC
  Administered 2023-03-09 – 2023-03-10 (×4): 600 mg via ORAL
  Filled 2023-03-09 (×4): qty 1

## 2023-03-09 MED ORDER — LACTATED RINGERS IV SOLN: 500.0000 mL | INTRAVENOUS | Status: DC | PRN

## 2023-03-09 MED ORDER — ACETAMINOPHEN 325 MG PO TABS
650.0000 mg | ORAL_TABLET | ORAL | Status: DC | PRN
Start: 2023-03-09 — End: 2023-03-09

## 2023-03-09 NOTE — H&P (Signed)
Melanie Mora is a 32 y.o. G 2 P 1 at 54 w 6 days presents for IOL secondary to Gestational hypertension - BP 140/96 in office yesterday. OB History     Gravida  2   Para  1   Term  1   Preterm  0   AB  0   Living  1      SAB  0   IAB  0   Ectopic  0   Multiple  0   Live Births  1          Past Medical History:  Diagnosis Date   Allergy    Pet dander   Anxiety 2008   Depression 2008   History of pre-eclampsia in prior pregnancy, currently pregnant    Normal labor 06/10/2021   Pregnancy induced hypertension    Past Surgical History:  Procedure Laterality Date   CHOLECYSTECTOMY     Family History: family history includes ADD / ADHD in her mother; Anxiety disorder in her mother, sister, sister, and sister; Cancer in her maternal grandmother; Depression in her mother, sister, and sister; Hypertension in her father; Miscarriages / Stillbirths in her mother; Stroke in her mother; Vision loss in her mother. Social History:  reports that she has never smoked. She has never used smokeless tobacco. She reports that she does not currently use alcohol. She reports that she does not use drugs.     Maternal Diabetes: No Genetic Screening: Normal Maternal Ultrasounds/Referrals: Normal Fetal Ultrasounds or other Referrals:  None Maternal Substance Abuse:  No Significant Maternal Medications:  None Significant Maternal Lab Results:  None Number of Prenatal Visits:greater than 3 verified prenatal visits Maternal Vaccinations:Flu Other Comments:  None  Review of Systems  All other systems reviewed and are negative.  Maternal Medical History:  Fetal activity: Perceived fetal activity is normal.      Height 5\' 3"  (1.6 m), weight 92.1 kg, not currently breastfeeding. Maternal Exam:  Uterine Assessment: Contraction strength is mild.  Contraction frequency is irregular.  Abdomen: Fetal presentation: vertex   Fetal Exam Fetal State Assessment: Category I - tracings  are normal.  Physical Exam Vitals and nursing note reviewed. Exam conducted with a chaperone present.  Constitutional:      Appearance: Normal appearance.  HENT:     Head: Normocephalic.     Right Ear: Tympanic membrane normal.     Mouth/Throat:     Mouth: Mucous membranes are moist.  Cardiovascular:     Rate and Rhythm: Normal rate and regular rhythm.     Pulses: Normal pulses.  Neurological:     Mental Status: She is alert.     Prenatal labs: ABO, Rh:   Antibody: Negative (04/16 0000) Rubella: Immune (04/16 0000) RPR: Nonreactive (04/16 0000)  HBsAg: Negative (04/16 0000)  HIV: Non-reactive (04/16 0000)  GBS: Negative/-- (10/16 0000)   Assessment/Plan: IUP at term Gestational HYPERTENSION IOL Admit Pitocin as needed  Epidural prn patient request    Jeani Hawking 03/09/2023, 9:55 AM

## 2023-03-09 NOTE — Lactation Note (Signed)
This note was copied from a baby's chart. Lactation Consultation Note  Patient Name: Melanie Mora [id number] Date: 03/09/2023 Age:31 hours Reason for consult: Initial assessment;Term  P2- MOB reports that infant has been nursing well and frequently. Infant was latched when [facility] entered the room, but MOB was just starting to take her off. Infant had a deep latch and MOB's nipple was well rounded when she came off. MOB denies having any pain or pinching when infant is on the breasts. MOB also denies having any questions or concerns at this moment.  LC reviewed feeding infant on cue 8-12x in 24 hrs, not allowing infant to go over 3 hrs without a feeding, CDC milk storage guidelines and LC services handout. LC encouraged MOB to call lactation as needed.  Maternal Data Does the patient have breastfeeding experience prior to this delivery?: Yes How long did the patient breastfeed?: 1 year  Feeding Mother's Current Feeding Choice: Breast Milk  LATCH Score Latch: Grasps breast easily, tongue down, lips flanged, rhythmical sucking.  Audible Swallowing: Spontaneous and intermittent  Type of Nipple: Everted at rest and after stimulation  Comfort (Breast/Nipple): Soft / non-tender  Hold (Positioning): No assistance needed to correctly position infant at breast.  LATCH Score: 10   Lactation Tools Discussed/Used Pump Education: Milk Storage  Interventions Interventions: Breast feeding basics reviewed;Education;LC Services brochure  Discharge Discharge Education: Warning signs for feeding baby Pump: DEBP;Hands Free;Personal  Consult Status Consult Status: Follow-up Date: 03/10/23 Follow-up type: In-patient    Dema Severin BS, IBCLC 03/09/2023, 8:35 PM

## 2023-03-09 NOTE — Anesthesia Preprocedure Evaluation (Signed)
Anesthesia Evaluation  Patient identified by MRN, date of birth, ID band Patient awake    Reviewed: Allergy & Precautions, H&P , NPO status , Patient's Chart, lab work & pertinent test results, reviewed documented beta blocker date and time   Airway Mallampati: II  TM Distance: >3 FB Neck ROM: full    Dental no notable dental hx.    Pulmonary neg pulmonary ROS   Pulmonary exam normal breath sounds clear to auscultation       Cardiovascular hypertension, negative cardio ROS Normal cardiovascular exam Rhythm:regular Rate:Normal     Neuro/Psych negative neurological ROS  negative psych ROS   GI/Hepatic negative GI ROS, Neg liver ROS,,,  Endo/Other  negative endocrine ROS    Renal/GU negative Renal ROS  negative genitourinary   Musculoskeletal   Abdominal   Peds  Hematology negative hematology ROS (+)   Anesthesia Other Findings   Reproductive/Obstetrics (+) Pregnancy                             Anesthesia Physical Anesthesia Plan  ASA: 2  Anesthesia Plan: Epidural   Post-op Pain Management: Minimal or no pain anticipated   Induction: Intravenous  PONV Risk Score and Plan: 2  Airway Management Planned: Natural Airway and Simple Face Mask  Additional Equipment: None  Intra-op Plan:   Post-operative Plan:   Informed Consent: I have reviewed the patients History and Physical, chart, labs and discussed the procedure including the risks, benefits and alternatives for the proposed anesthesia with the patient or authorized representative who has indicated his/her understanding and acceptance.       Plan Discussed with: Anesthesiologist and CRNA  Anesthesia Plan Comments:        Anesthesia Quick Evaluation

## 2023-03-09 NOTE — Anesthesia Procedure Notes (Signed)
Epidural Patient location during procedure: OB Start time: 03/09/2023 11:52 AM End time: 03/09/2023 12:02 PM  Staffing Anesthesiologist: Atilano Median, DO Performed: other anesthesia staff   Preanesthetic Checklist Completed: patient identified, IV checked, site marked, risks and benefits discussed, surgical consent, monitors and equipment checked, pre-op evaluation and timeout performed  Epidural Patient position: sitting Prep: DuraPrep and site prepped and draped Patient monitoring: continuous pulse ox and blood pressure Approach: midline Location: L3-L4 Injection technique: LOR air  Needle:  Needle type: Tuohy  Needle gauge: 17 G Needle length: 9 cm and 9 Needle insertion depth: 6 cm Catheter type: closed end flexible Catheter size: 19 Gauge Catheter at skin depth: 12 cm Test dose: negative  Assessment Events: blood not aspirated, no cerebrospinal fluid, injection not painful, no injection resistance, no paresthesia and negative IV test

## 2023-03-10 LAB — CBC
HCT: 29.8 % — ABNORMAL LOW (ref 36.0–46.0)
Hemoglobin: 9.9 g/dL — ABNORMAL LOW (ref 12.0–15.0)
MCH: 28.2 pg (ref 26.0–34.0)
MCHC: 33.2 g/dL (ref 30.0–36.0)
MCV: 84.9 fL (ref 80.0–100.0)
Platelets: 165 10*3/uL (ref 150–400)
RBC: 3.51 MIL/uL — ABNORMAL LOW (ref 3.87–5.11)
RDW: 15 % (ref 11.5–15.5)
WBC: 9.9 10*3/uL (ref 4.0–10.5)
nRBC: 0 % (ref 0.0–0.2)

## 2023-03-10 LAB — HIV ANTIBODY (ROUTINE TESTING W REFLEX): HIV Screen 4th Generation wRfx: NONREACTIVE

## 2023-03-10 NOTE — Progress Notes (Signed)
CSW received consult for hx of Anxiety/ Depression and edinburgh score 10.  CSW met with MOB to offer support and complete assessment, MOB was accompanied by female guest and FOB. CSW introduced self and MOB granted CSW verbal permission to speak in front of guests about anything. CSW explained reason for consult. MOB was welcoming, pleasant, open, and remained engaged during assessment. CSW and MOB discussed MOB's mental health history. MOB reported a history of anxiety, depression, and postpartum depression. MOB reported that she is currently taking medication for anxiety and depression daily. MOB reported that she also has a therapist that she can call as needed for appointments. MOB endorsed a history of postpartum depression and reported that the symptoms subsided on their own. MOB shared that it felt like her typical depression just in the postpartum period. CSW and MOB discussed elevated edinburgh score 10. MOB attributed elevated score to having a stressful few weeks planning for the arrival of infant. CSW acknowledged, normalized, and validated MOB's feelings surrounding her experience over the past few weeks. MOB reported that her anxiety is essentially gone now and denied any depressive symptoms. CSW inquired about how MOB was feeling emotionally since giving birth, MOB reported that she was feeling good and really tired. MOB presented calm and did not demonstrate any acute mental health signs/symptoms. 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 CSW assessed for safety, MOB denied SI and HI. CSW inquired about MOB's support system, MOB reported that she has a good support system.   CSW provided education regarding the baby blues period vs. perinatal mood disorders, discussed treatment and gave resources for mental health follow up if concerns arise.  CSW recommends self-evaluation during the postpartum time period using the New Mom Checklist from  Postpartum Progress and encouraged MOB to contact a medical professional if symptoms are noted at any time.    CSW inquired about any needs for resources/supports, MOB reported no needs.    CSW identifies no further need for intervention and no barriers to discharge at this time.  Celso Sickle, LCSW Clinical Social Worker [facility] Cell#: [phone]

## 2023-03-10 NOTE — Anesthesia Postprocedure Evaluation (Signed)
Anesthesia Post Note  Patient: Melanie Mora  Procedure(s) Performed: AN AD HOC LABOR EPIDURAL     Patient location during evaluation: Mother Baby Anesthesia Type: Epidural Level of consciousness: awake and alert and oriented Pain management: satisfactory to patient Vital Signs Assessment: post-procedure vital signs reviewed and stable Respiratory status: respiratory function stable Cardiovascular status: stable Postop Assessment: no headache, no backache, epidural receding, patient able to bend at knees, no signs of nausea or vomiting, adequate PO intake and able to ambulate Anesthetic complications: no   No notable events documented.  Last Vitals:  Vitals:   03/09/23 2315 03/10/23 0430  BP: (!) 127/94 117/80  Pulse: 83 68  Resp: 18 16  Temp:  36.9 C  SpO2: 98% 99%    Last Pain:  Vitals:   03/10/23 0735  TempSrc:   PainSc: 0-No pain   Pain Goal:                   Dorna Mallet

## 2023-03-10 NOTE — Discharge Summary (Signed)
Postpartum Discharge Summary  Date of Service March 10, 2023     Patient Name: Melanie Mora DOB: 09-10-1990 MRN: [id number]  Date of admission: 03/09/2023 Delivery date:03/09/2023 Delivering provider: Marcelle Overlie Date of discharge: 03/10/2023  Admitting diagnosis: Gestational hypertension [O13.9] S/P cesarean section [Z98.891] NSVD (normal spontaneous vaginal delivery) [O80] Intrauterine pregnancy: [redacted]w[redacted]d     Secondary diagnosis:  Principal Problem:   Gestational hypertension Active Problems:   S/P cesarean section   NSVD (normal spontaneous vaginal delivery)  Additional problems: none    Discharge diagnosis: Term Pregnancy Delivered                                              Post partum procedures: none Augmentation: AROM Complications: None  Hospital course: Induction of Labor With Vaginal Delivery   32 y.o. yo [id number] at [redacted]w[redacted]d was admitted to the hospital 03/09/2023 for induction of labor.  Indication for induction gestational hypertension  Patient had an labor course complicated by nothing Membrane Rupture Time/Date: 9:55 AM,03/09/2023  Delivery Method:Vaginal, Spontaneous Operative Delivery:N/A Episiotomy: None Lacerations:  None Details of delivery can be found in separate delivery note.  Patient had a postpartum course complicated bynothing. Patient is discharged home 03/10/23.  Newborn Data: Birth date:03/09/2023 Birth time:2:30 PM Gender:Female Living status:Living Apgars:8 ,9  Weight:3880 g  Magnesium Sulfate received: No BMZ received: No Rhophylac:N/A MMR:N/A T-DaP:Given prenatally Flu: Yes RSV Vaccine received: No Transfusion:No Immunizations administered: Immunization History  Administered Date(s) Administered   Influenza-Unspecified 04/05/2020, 02/05/2021   Tdap 09/01/2018, 04/08/2021    Physical exam  Vitals:   03/09/23 1700 03/09/23 1829 03/09/23 2315 03/10/23 0430  BP: 119/88 (!) 122/91 (!) 127/94 117/80  Pulse: 93 92 83 68   Resp: 18 16 18 16   Temp: 98.3 F (36.8 C) 98.7 F (37.1 C)  98.4 F (36.9 C)  TempSrc:  Oral  Oral  SpO2: 97%  98% 99%  Weight:      Height:       General: alert, cooperative, and no distress Lochia: appropriate Uterine Fundus: firm Incision: Healing well with no significant drainage DVT Evaluation: No evidence of DVT seen on physical exam. Labs: Lab Results  Component Value Date   WBC 9.9 03/10/2023   HGB 9.9 (L) 03/10/2023   HCT 29.8 (L) 03/10/2023   MCV 84.9 03/10/2023   PLT 165 03/10/2023      Latest Ref Rng & Units 07/07/2022    9:00 AM  CMP  Glucose 70 - 99 mg/dL 89   BUN 6 - 23 mg/dL 7   Creatinine 1.47 - 8.29 mg/dL 5.62   Sodium 130 - 865 mEq/L 139   Potassium 3.5 - 5.1 mEq/L 4.6   Chloride 96 - 112 mEq/L 106   CO2 19 - 32 mEq/L 26   Calcium 8.4 - 10.5 mg/dL 9.4   Total Protein 6.0 - 8.3 g/dL 6.6   Total Bilirubin 0.2 - 1.2 mg/dL 1.2   Alkaline Phos 39 - 117 U/L 55   AST 0 - 37 U/L 14   ALT 0 - 35 U/L 20    Edinburgh Score:    06/12/2021   11:13 AM  Edinburgh Postnatal Depression Scale Screening Tool  I have been able to laugh and see the funny side of things. 0  I have looked forward with enjoyment to things. 1  I have blamed myself  unnecessarily when things went wrong. 2  I have been anxious or worried for no good reason. 2  I have felt scared or panicky for no good reason. 2  Things have been getting on top of me. 2  I have been so unhappy that I have had difficulty sleeping. 1  I have felt sad or miserable. 1  I have been so unhappy that I have been crying. 1  The thought of harming myself has occurred to me. 0  Edinburgh Postnatal Depression Scale Total 12      After visit meds:  Allergies as of 03/10/2023   No Known Allergies      Medication List     STOP taking these medications    aspirin 81 MG chewable tablet   buPROPion 300 MG 24 hr tablet Commonly known as: WELLBUTRIN XL   Cholecalciferol 100 MCG (4000 UT) Caps   ferrous  sulfate 324 MG Tbec   fexofenadine 180 MG tablet Commonly known as: ALLEGRA   FLUoxetine 20 MG capsule Commonly known as: PROZAC   hydrOXYzine 25 MG tablet Commonly known as: ATARAX   LORazepam 0.5 MG tablet Commonly known as: ATIVAN   ondansetron 4 MG disintegrating tablet Commonly known as: ZOFRAN-ODT   Potassium Gluconate 550 (90 K) MG Tabs   Probiotic (Lactobacillus) Caps       TAKE these medications    acetaminophen 325 MG tablet Commonly known as: Tylenol Take 2 tablets (650 mg total) by mouth every 4 (four) hours as needed (for pain scale < 4).   prenatal multivitamin Tabs tablet Take 1 tablet by mouth daily at 12 noon.         Discharge home in stable condition Infant Feeding: Breast Infant Disposition:home with mother Discharge instruction: per After Visit Summary and Postpartum booklet. Activity: Advance as tolerated. Pelvic rest for 6 weeks.  Diet: routine diet Anticipated Birth Control: Unsure Postpartum Appointment:1 week Additional Postpartum F/U: BP check 1 week Future Appointments:No future appointments. Follow up Visit:      03/10/2023 Jeani Hawking, MD

## 2023-03-10 NOTE — Lactation Note (Signed)
This note was copied from a baby's chart. Lactation Consultation Note  Patient Name: Melanie Mora Date: 03/10/2023 Age:31 hours Reason for consult: Follow-up assessment;Maternal discharge;Infant weight loss (weight loss -2.60%).  Infant had 2 voids and 5 stools since birth.   Per MOB, infant latches well on the right breast but has been experiencing pain when infant latches on the left breast. MOB had finished breastfeeding infant prior to [facility] entering the room, but was open to [facility] assisting with latching infant on her left breast. LC suggested pillow support to bring infant even and in alignment with her left breast. MOB used the cross cradle hold, LC worked with MOB having deeper latch, tips were given. Per MOB, the latch felt better and infant was still breastfeeding after 13 minutes when LC left the room.MOB knows to break latch and re-latch infant if experiencing any pain or discomfort. LC suggested MOB a to attend the Postpartum BF support Group at [facility] to help with further latch assistance after discharge if needed. MOB understands the Postpartum BF Support Group is free.   Current Feeding Plan: 1- MOB will continue to BF infant by cues, on demand every 2-3 hours, skin to skin and knows that after 24 hours infant may cluster feed and this is normal infant feeding behavior. 2- LC reinforced importance of maternal rest, diet and hydration. 3- MOB has community resources for further BF assistance if needed.   Discharge education: 1- Discussed prevention and treatment of Engorgment. 2- How to know breastfeeding is going well: infant feeding at breast, input and output, etc. 3- Warning signs of dehydration in infant.   Maternal Data    Feeding Mother's Current Feeding Choice: Breast Milk  LATCH Score Latch: Grasps breast easily, tongue down, lips flanged, rhythmical sucking. (MOB wanted latch assistance with her left breast, infant latches well on the right  breast.)  Audible Swallowing: Spontaneous and intermittent  Type of Nipple: Everted at rest and after stimulation  Comfort (Breast/Nipple): Soft / non-tender  Hold (Positioning): Assistance needed to correctly position infant at breast and maintain latch.  LATCH Score: 9   Lactation Tools Discussed/Used    Interventions Interventions: Skin to skin;Assisted with latch;Breast compression;Adjust position;Support pillows;Position options;Education  Discharge Discharge Education: Engorgement and breast care;Warning signs for feeding baby;Other (comment) (Suggested PP BF Support Group at [facility].) Pump: DEBP;Personal  Consult Status Consult Status: Complete Date: 03/10/23 Follow-up type: Physician    Frederico Hamman 03/10/2023, 3:43 PM

## 2023-03-17 ENCOUNTER — Inpatient Hospital Stay (HOSPITAL_COMMUNITY): Payer: BC Managed Care – PPO

## 2023-03-23 ENCOUNTER — Telehealth (HOSPITAL_COMMUNITY): Payer: Self-pay | Admitting: *Deleted

## 2023-03-23 NOTE — Telephone Encounter (Signed)
03/23/2023  Name: Melanie Mora MRN: [id number] DOB: 1990-09-04  Reason for Call:  Transition of Care Hospital Discharge Call  Contact Status: Patient Contact Status: Message  Language assistant needed:          Follow-Up Questions:    Inocente Salles Postnatal Depression Scale:  In the Past 7 Days:    PHQ2-9 Depression Scale:     Discharge Follow-up:    Post-discharge interventions: NA  Salena Saner, RN 03/23/2023 11:33

## 2023-04-18 DIAGNOSIS — Z1389 Encounter for screening for other disorder: Secondary | ICD-10-CM | POA: Diagnosis not present

## 2023-05-07 DIAGNOSIS — F840 Autistic disorder: Secondary | ICD-10-CM | POA: Diagnosis not present

## 2023-05-07 DIAGNOSIS — F341 Dysthymic disorder: Secondary | ICD-10-CM | POA: Diagnosis not present

## 2023-05-07 DIAGNOSIS — F411 Generalized anxiety disorder: Secondary | ICD-10-CM | POA: Diagnosis not present

## 2023-05-14 DIAGNOSIS — Z30430 Encounter for insertion of intrauterine contraceptive device: Secondary | ICD-10-CM | POA: Diagnosis not present

## 2023-05-14 DIAGNOSIS — Z3202 Encounter for pregnancy test, result negative: Secondary | ICD-10-CM | POA: Diagnosis not present

## 2023-05-17 DIAGNOSIS — F341 Dysthymic disorder: Secondary | ICD-10-CM | POA: Diagnosis not present

## 2023-05-17 DIAGNOSIS — F411 Generalized anxiety disorder: Secondary | ICD-10-CM | POA: Diagnosis not present

## 2023-05-17 DIAGNOSIS — F840 Autistic disorder: Secondary | ICD-10-CM | POA: Diagnosis not present

## 2023-05-25 DIAGNOSIS — F341 Dysthymic disorder: Secondary | ICD-10-CM | POA: Diagnosis not present

## 2023-05-25 DIAGNOSIS — F411 Generalized anxiety disorder: Secondary | ICD-10-CM | POA: Diagnosis not present

## 2023-05-25 DIAGNOSIS — F840 Autistic disorder: Secondary | ICD-10-CM | POA: Diagnosis not present

## 2023-05-31 DIAGNOSIS — F341 Dysthymic disorder: Secondary | ICD-10-CM | POA: Diagnosis not present

## 2023-05-31 DIAGNOSIS — F411 Generalized anxiety disorder: Secondary | ICD-10-CM | POA: Diagnosis not present

## 2023-05-31 DIAGNOSIS — F840 Autistic disorder: Secondary | ICD-10-CM | POA: Diagnosis not present

## 2023-06-25 DIAGNOSIS — Z30431 Encounter for routine checking of intrauterine contraceptive device: Secondary | ICD-10-CM | POA: Diagnosis not present

## 2023-08-14 ENCOUNTER — Other Ambulatory Visit: Payer: Self-pay

## 2023-08-14 ENCOUNTER — Ambulatory Visit
Admission: RE | Admit: 2023-08-14 | Discharge: 2023-08-14 | Disposition: A | Discharge status: Home / Self Care | Source: Ambulatory Visit

## 2023-08-14 VITALS — BP 108/75 | HR 95 | Temp 98.1°F | Resp 18 | Ht 63.0 in | Wt 195.0 lb

## 2023-08-14 DIAGNOSIS — H5711 Ocular pain, right eye: Secondary | ICD-10-CM | POA: Diagnosis not present

## 2023-08-14 NOTE — Discharge Instructions (Signed)
 Please go to [facility] tomorrow at 8 AM for further evaluation.

## 2023-08-14 NOTE — ED Provider Notes (Signed)
 [facility]    CSN: [id number] Arrival date & time: 08/14/23  1202      History   Chief Complaint Chief Complaint  Patient presents with   Eye Problem    HPI Melanie Mora is a 33 y.o. female.   Patient presents with right eye discomfort that started yesterday.  Reports that her right upper eyelid has been twitching and she has had some watery drainage from the eye with mild discomfort.  Denies any trauma or foreign body to the eye.  She does wear contact lenses but has had them out over the past 24 hours.  She reports a little bit of "foggy" vision.   Eye Problem   Past Medical History:  Diagnosis Date   Allergy    Pet dander   Anxiety 2008   Depression 2008   History of pre-eclampsia in prior pregnancy, currently pregnant    Normal labor 06/10/2021   Pregnancy induced hypertension     Patient Active Problem List   Diagnosis Date Noted   Gestational hypertension 03/09/2023   S/P cesarean section 03/09/2023   NSVD (normal spontaneous vaginal delivery) 03/09/2023   Anxiety and depression 06/26/2022    Past Surgical History:  Procedure Laterality Date   CHOLECYSTECTOMY      OB History     Gravida  2   Para  2   Term  2   Preterm  0   AB  0   Living  2      SAB  0   IAB  0   Ectopic  0   Multiple  0   Live Births  2            Home Medications    Prior to Admission medications   Medication Sig Start Date End Date Taking? Authorizing Provider  FLUoxetine (PROZAC) 20 MG capsule Take 60 mg by mouth daily. 04/13/23  Yes [provider]  levonorgestrel (MIRENA, 52 MG,) 20 MCG/DAY IUD 1 each by Intrauterine route. 05/14/23  Yes [provider]  acetaminophen (TYLENOL) 325 MG tablet Take 2 tablets (650 mg total) by mouth every 4 (four) hours as needed (for pain scale < 4). 06/12/21   Lyn Henri, MD  buPROPion (WELLBUTRIN XL) 300 MG 24 hr tablet Take 300 mg by mouth daily.    [provider]   Prenatal Vit-Fe Fumarate-FA (PRENATAL MULTIVITAMIN) TABS tablet Take 1 tablet by mouth daily at 12 noon.    [provider]    Family History Family History  Problem Relation Age of Onset   ADD / ADHD Mother    Anxiety disorder Mother    Depression Mother    Miscarriages / [place] Mother    Stroke Mother    Vision loss Mother    Hypertension Father    Anxiety disorder Sister    Depression Sister    Anxiety disorder Sister    Depression Sister    Anxiety disorder Sister    Cancer Maternal Grandmother     Social History Social History   Tobacco Use   Smoking status: Never   Smokeless tobacco: Never  Vaping Use   Vaping status: Never Used  Substance Use Topics   Alcohol use: Not Currently    Comment: 0-1 a month   Drug use: Never     Allergies   Patient has no known allergies.   Review of Systems Review of Systems Per HPI  Physical Exam Triage Vital Signs ED Triage Vitals  Encounter Vitals Group     BP 08/14/23 1213 108/75     Systolic BP Percentile --      Diastolic BP Percentile --      Pulse Rate 08/14/23 1213 95     Resp 08/14/23 1213 18     Temp 08/14/23 1213 98.1 F (36.7 C)     Temp Source 08/14/23 1213 Oral     SpO2 08/14/23 1213 96 %     Weight 08/14/23 1214 195 lb (88.5 kg)     Height 08/14/23 1214 5\' 3"  (1.6 m)     Head Circumference --      Peak Flow --      Pain Score 08/14/23 1213 0     Pain Loc --      Pain Education --      Exclude from Growth Chart --    No data found.  Updated Vital Signs BP 108/75 (BP Location: Right Arm)   Pulse 95   Temp 98.1 F (36.7 C) (Oral)   Resp 18   Ht 5\' 3"  (1.6 m)   Wt 195 lb (88.5 kg)   SpO2 96%   Breastfeeding Yes   BMI 34.54 kg/m   Visual Acuity Right Eye Distance:   Left Eye Distance:   Bilateral Distance:    Right Eye Near:   Left Eye Near:    Bilateral Near:     Physical Exam Constitutional:      General: She is not in acute distress.    Appearance: Normal  appearance. She is not toxic-appearing or diaphoretic.  HENT:     Head: Normocephalic and atraumatic.  Eyes:     General: Lids are normal. Lids are everted, no foreign bodies appreciated. Vision grossly intact. Gaze aligned appropriately.     Extraocular Movements: Extraocular movements intact.     Conjunctiva/sclera: Conjunctivae normal.     Pupils: Pupils are equal, round, and reactive to light.     Right eye: No corneal abrasion or fluorescein uptake.  Pulmonary:     Effort: Pulmonary effort is normal.  Neurological:     General: No focal deficit present.     Mental Status: She is alert and oriented to person, place, and time. Mental status is at baseline.  Psychiatric:        Mood and Affect: Mood normal.        Behavior: Behavior normal.        Thought Content: Thought content normal.        Judgment: Judgment normal.      UC Treatments / Results  Labs (all labs ordered are listed, but only abnormal results are displayed) Labs Reviewed - No data to display  EKG   Radiology No results found.  Procedures Procedures (including critical care time)  Medications Ordered in UC Medications - No data to display  Initial Impression / Assessment and Plan / UC Course  I have reviewed the triage vital signs and the nursing notes.  Pertinent labs & imaging results that were available during my care of the patient were reviewed by me and considered in my medical decision making (see chart for details).     No obvious abnormality noted on physical exam.  No signs of bacterial conjunctivitis.  Fluorescein stain completed which was unremarkable for any type of abrasion or ulcer.  Recommended ophthalmology evaluation given limited resources here in urgent care.  Called [facility] and scheduled patient an appointment for tomorrow at 8 AM for evaluation.  Visual acuity appears normal on exam.  She was agreeable with this plan. Final Clinical Impressions(s) / UC Diagnoses    Final diagnoses:  Discomfort of right eye     Discharge Instructions      Please go to [facility] tomorrow at 8 AM for further evaluation.    ED Prescriptions   None    PDMP not reviewed this encounter.   Dodson [place], [place] 08/14/23 1258

## 2023-08-14 NOTE — ED Triage Notes (Signed)
 Pt presents with complaints of right eye redness that she noticed yesterday 4/14. Denies itching and pain at this time. Pt states the right eye has been twitching. Denies applying or taking medications PTA.

## 2023-08-27 DIAGNOSIS — F339 Major depressive disorder, recurrent, unspecified: Secondary | ICD-10-CM | POA: Diagnosis not present

## 2023-08-27 DIAGNOSIS — F411 Generalized anxiety disorder: Secondary | ICD-10-CM | POA: Diagnosis not present

## 2023-08-27 DIAGNOSIS — F900 Attention-deficit hyperactivity disorder, predominantly inattentive type: Secondary | ICD-10-CM | POA: Diagnosis not present

## 2023-10-31 DIAGNOSIS — F411 Generalized anxiety disorder: Secondary | ICD-10-CM | POA: Diagnosis not present

## 2023-10-31 DIAGNOSIS — F900 Attention-deficit hyperactivity disorder, predominantly inattentive type: Secondary | ICD-10-CM | POA: Diagnosis not present

## 2023-10-31 DIAGNOSIS — F339 Major depressive disorder, recurrent, unspecified: Secondary | ICD-10-CM | POA: Diagnosis not present

## 2024-01-09 DIAGNOSIS — F900 Attention-deficit hyperactivity disorder, predominantly inattentive type: Secondary | ICD-10-CM | POA: Diagnosis not present

## 2024-01-09 DIAGNOSIS — F411 Generalized anxiety disorder: Secondary | ICD-10-CM | POA: Diagnosis not present

## 2024-01-09 DIAGNOSIS — F339 Major depressive disorder, recurrent, unspecified: Secondary | ICD-10-CM | POA: Diagnosis not present

## 2024-04-09 DIAGNOSIS — F900 Attention-deficit hyperactivity disorder, predominantly inattentive type: Secondary | ICD-10-CM | POA: Diagnosis not present

## 2024-04-09 DIAGNOSIS — F339 Major depressive disorder, recurrent, unspecified: Secondary | ICD-10-CM | POA: Diagnosis not present

## 2024-04-09 DIAGNOSIS — F411 Generalized anxiety disorder: Secondary | ICD-10-CM | POA: Diagnosis not present
